# Patient Record
Sex: Male | Born: 1970 | Race: White | Hispanic: No | Marital: Married | Smoking: Never smoker
Health system: Southern US, Community
[De-identification: ages and names within clinical notes are randomized; demographics above are authoritative.]

## PROBLEM LIST (undated history)

## (undated) DIAGNOSIS — R112 Nausea with vomiting, unspecified: Secondary | ICD-10-CM

## (undated) DIAGNOSIS — R350 Frequency of micturition: Secondary | ICD-10-CM

## (undated) DIAGNOSIS — N201 Calculus of ureter: Secondary | ICD-10-CM

## (undated) DIAGNOSIS — Z9889 Other specified postprocedural states: Secondary | ICD-10-CM

## (undated) HISTORY — PX: OTHER SURGICAL HISTORY: SHX169

## (undated) HISTORY — PX: APPENDECTOMY: SHX54

---

## 1997-12-12 ENCOUNTER — Inpatient Hospital Stay (HOSPITAL_COMMUNITY): Admission: RE | Admit: 1997-12-12 | Discharge: 1997-12-16 | Payer: Self-pay | Admitting: Neurosurgery

## 1998-05-17 HISTORY — PX: OTHER SURGICAL HISTORY: SHX169

## 1998-06-19 ENCOUNTER — Ambulatory Visit (HOSPITAL_COMMUNITY): Admission: RE | Admit: 1998-06-19 | Discharge: 1998-06-19 | Payer: Self-pay | Admitting: Neurosurgery

## 1998-06-19 ENCOUNTER — Encounter: Payer: Self-pay | Admitting: Neurosurgery

## 1998-07-09 ENCOUNTER — Encounter: Payer: Self-pay | Admitting: Neurosurgery

## 1998-07-09 ENCOUNTER — Ambulatory Visit (HOSPITAL_COMMUNITY): Admission: RE | Admit: 1998-07-09 | Discharge: 1998-07-09 | Payer: Self-pay | Admitting: Neurosurgery

## 1999-05-28 ENCOUNTER — Encounter: Payer: Self-pay | Admitting: Neurosurgery

## 1999-05-28 ENCOUNTER — Ambulatory Visit (HOSPITAL_COMMUNITY): Admission: RE | Admit: 1999-05-28 | Discharge: 1999-05-28 | Payer: Self-pay | Admitting: Neurosurgery

## 2000-01-13 ENCOUNTER — Ambulatory Visit (HOSPITAL_BASED_OUTPATIENT_CLINIC_OR_DEPARTMENT_OTHER): Admission: RE | Admit: 2000-01-13 | Discharge: 2000-01-13 | Payer: Self-pay | Admitting: *Deleted

## 2000-01-13 HISTORY — PX: INGUINAL HERNIA REPAIR: SUR1180

## 2001-01-05 ENCOUNTER — Encounter: Payer: Self-pay | Admitting: Neurosurgery

## 2001-01-05 ENCOUNTER — Ambulatory Visit (HOSPITAL_COMMUNITY): Admission: RE | Admit: 2001-01-05 | Discharge: 2001-01-05 | Payer: Self-pay | Admitting: Neurosurgery

## 2001-05-17 HISTORY — PX: KNEE ARTHROSCOPY W/ MENISCECTOMY: SHX1879

## 2002-06-21 ENCOUNTER — Encounter: Payer: Self-pay | Admitting: Neurosurgery

## 2002-06-21 ENCOUNTER — Ambulatory Visit (HOSPITAL_COMMUNITY): Admission: RE | Admit: 2002-06-21 | Discharge: 2002-06-21 | Payer: Self-pay | Admitting: Neurosurgery

## 2005-04-22 ENCOUNTER — Ambulatory Visit: Payer: Self-pay | Admitting: Family Medicine

## 2005-04-29 ENCOUNTER — Ambulatory Visit: Payer: Self-pay | Admitting: Family Medicine

## 2005-09-03 ENCOUNTER — Ambulatory Visit: Payer: Self-pay | Admitting: Family Medicine

## 2006-01-24 ENCOUNTER — Ambulatory Visit: Payer: Self-pay | Admitting: Family Medicine

## 2006-02-28 ENCOUNTER — Ambulatory Visit: Payer: Self-pay | Admitting: Family Medicine

## 2006-09-21 ENCOUNTER — Encounter: Admission: RE | Admit: 2006-09-21 | Discharge: 2006-09-21 | Payer: Self-pay | Admitting: Neurosurgery

## 2010-06-07 ENCOUNTER — Encounter: Payer: Self-pay | Admitting: Neurosurgery

## 2010-11-11 ENCOUNTER — Other Ambulatory Visit: Payer: Self-pay | Admitting: Neurosurgery

## 2010-11-11 DIAGNOSIS — M479 Spondylosis, unspecified: Secondary | ICD-10-CM

## 2010-11-11 DIAGNOSIS — IMO0002 Reserved for concepts with insufficient information to code with codable children: Secondary | ICD-10-CM

## 2010-11-11 DIAGNOSIS — M792 Neuralgia and neuritis, unspecified: Secondary | ICD-10-CM

## 2010-11-17 ENCOUNTER — Other Ambulatory Visit: Payer: Self-pay

## 2010-11-19 ENCOUNTER — Other Ambulatory Visit: Payer: Self-pay

## 2010-11-19 ENCOUNTER — Ambulatory Visit
Admission: RE | Admit: 2010-11-19 | Discharge: 2010-11-19 | Disposition: A | Discharge status: Home / Self Care | Payer: Medicare Other | Source: Ambulatory Visit | Attending: Neurosurgery | Admitting: Neurosurgery

## 2010-11-19 ENCOUNTER — Ambulatory Visit
Admission: RE | Admit: 2010-11-19 | Discharge: 2010-11-19 | Disposition: A | Discharge status: Home / Self Care | Payer: Self-pay | Source: Ambulatory Visit | Attending: Neurosurgery | Admitting: Neurosurgery

## 2010-11-19 DIAGNOSIS — M792 Neuralgia and neuritis, unspecified: Secondary | ICD-10-CM

## 2010-11-19 DIAGNOSIS — M479 Spondylosis, unspecified: Secondary | ICD-10-CM

## 2010-11-19 DIAGNOSIS — IMO0002 Reserved for concepts with insufficient information to code with codable children: Secondary | ICD-10-CM

## 2010-11-19 MED ORDER — GADOBENATE DIMEGLUMINE 529 MG/ML IV SOLN
13.0000 mL | Freq: Once | INTRAVENOUS | Status: AC | PRN
  Administered 2010-11-19: 13 mL via INTRAVENOUS

## 2011-10-08 ENCOUNTER — Other Ambulatory Visit: Payer: Self-pay | Admitting: Urology

## 2011-10-12 ENCOUNTER — Encounter (HOSPITAL_COMMUNITY): Payer: Self-pay | Admitting: *Deleted

## 2011-10-12 NOTE — Progress Notes (Signed)
Bring blue folder with paper work completed nurse to review

## 2011-10-12 NOTE — Progress Notes (Signed)
No Aspirin, Ibuprofen,Toradol 72 hours prior to procedure, [employer] card, ID, eat a light dinner and take a laxative by 6 pm 10-13-11  May have clear liquids until 1130 10-14-11 then NPO for procedure may take pain med Dilaudid if needed

## 2011-10-13 NOTE — H&P (Signed)
 Problems Problems  1. Nephrolithiasis Of The Right Kidney 592.0  History of Present Illness  Andrew Patel is a 41 yo WM sent in consultation by Dr. Nyland for a 12mm right UPJ stone with obstruction.  He had the onset in 2010 of right flank pain.  He has had low back pain since that time and had an evaluation that revealed a small disc protrusion.  The pain got worse last week and he went to [facility] ER and a CT was done that showed the right sided stone.  I don't have a disc of the scan so I can't tell the density of the stone and his KUB today still has GI contrast over the right renal shadow.   He continues to have pain with some anorexia.  He has had no gross hematuria.  He has seen occasional blood in the stool.  He has a weak urinary stream with some post void dribbling.   Past Medical History Problems  1. History of  Lumbar Disc Degeneration 722.52  Surgical History Problems  1. History of  Arthroscopy Knee Left 2. History of  Arthroscopy Knee Right 3. History of  Inguinal Hernia Repair Right 4. History of  Knee Surgery Right 5. History of  Laminectomy Lumbar  Current Meds 1. Biotin 10 MG Oral Tablet; Therapy: 24May2013 to 2. HYDROmorphone HCl 2 MG Oral Tablet; Therapy: (Recorded:24May2013) to 3. Multi-Vitamin TABS; Therapy: (Recorded:24May2013) to  Allergies Medication  1. Codeine Derivatives  Family History Problems  1. Family history of  Nephrolithiasis  Social History Problems    Caffeine Use   Marital History - Currently Married   Never A Smoker   Occupation: Denied    History of  Alcohol Use  Review of Systems Genitourinary, constitutional, skin, eye, otolaryngeal, hematologic/lymphatic, cardiovascular, pulmonary, endocrine, musculoskeletal, gastrointestinal, neurological and psychiatric system(s) were reviewed and pertinent findings if present are noted.  Genitourinary: urinary frequency, feelings of urinary urgency, difficulty starting the urinary stream,  weak urinary stream and post-void dribbling.  Gastrointestinal: abdominal pain.  Constitutional: night sweats, feeling tired (fatigue) and recent weight loss.  Integumentary: skin rash/lesion and pruritus.  Eyes: blurred vision.  ENT: sinus problems.  Hematologic/Lymphatic: a tendency to easily bruise.  Musculoskeletal: back pain and joint pain.  Neurological: dizziness.  Psychiatric: anxiety.    Vitals Vital Signs [Data Includes: Last 1 Day]  24May2013 01:42PM  BMI Calculated: 22.21 BSA Calculated: 1.96 Height: 6 ft  Weight: 164 lb  Blood Pressure: 115 / 70 Temperature: 97.6 F Heart Rate: 82  Physical Exam Constitutional: Well nourished and well developed . No acute distress.  ENT:. The ears and nose are normal in appearance.  Neck: The appearance of the neck is normal and no neck mass is present.  Pulmonary: No respiratory distress and normal respiratory rhythm and effort.  Cardiovascular: Heart rate and rhythm are normal . No peripheral edema.  Abdomen: The abdomen is soft and nontender. No masses are palpated. moderate right CVA tenderness, but no left CVA tenderness. No hepatosplenomegaly noted.  Skin: Normal skin turgor, no visible rash and no visible skin lesions.  Neuro/Psych:. Mood and affect are appropriate.    Results/Data Urine [Data Includes: Last 1 Day]   24May2013  COLOR YELLOW   APPEARANCE CLEAR   SPECIFIC GRAVITY 1.025   pH 6.0   GLUCOSE NEG mg/dL  BILIRUBIN NEG   KETONE NEG mg/dL  BLOOD SMALL   PROTEIN TRACE mg/dL  UROBILINOGEN 0.2 mg/dL  NITRITE NEG   LEUKOCYTE ESTERASE NEG     SQUAMOUS EPITHELIAL/HPF NONE SEEN   WBC 0-3 WBC/hpf  RBC 7-10 RBC/hpf  BACTERIA NONE SEEN   CRYSTALS NONE SEEN   CASTS NONE SEEN   Other MUCUS    Old records or history reviewed: I have reviewed records from Dr. Nyland including the CT report.  The following images/tracing/specimen were independently visualized:  KUB today shows retained GI contrast that obscures the  right kidney. He has lumbar ray cages but no other abnormalities. I have reviewed his prior films in [facility] but he only has lumbar images that don't include the kidneys.  Will request old records/history: I have requested the CT films from [facility].    Assessment Assessed  1. Nephrolithiasis Of The Right Kidney 592.0   He has a 12mm RUPJ stone and I don't know if it is radiopaque or not.   Plan Health Maintenance (V70.0)  1. UA With REFLEX  Done: 24May2013 01:33PM Nephrolithiasis Of The Right Kidney (592.0)  2. HYDROmorphone HCl 2 MG Oral Tablet; One to two po q 4-6 hours prn pain; Last Rx:24May2013 3. Follow-up Office  Follow-up  Requested for: 28May2013 4. Follow-up Schedule Surgery Office  Follow-up  Requested for: 24May2013 5. Get Outside Records Office  Follow-up  Requested for: 24May2013 Unlinked  6. KUB  Done: 24May2013 12:00AM   I am going to refill his Dilaudid. He will do a fleets enema to try to clear the colon contrast. He will return on Tuesday for a KUB to see if the stone is visible. I will get the CT films. I discussed the options of ESWL, PCNL and ureteroscopy and believe the ESWL will be most appropriate if the stone is visible.  I reviewed the risks of bleeding, infection, renal and adjacent organ that can rarely be life threatening, failure of fragmentation, need for secondary procedures, thrombotic events and sedation risks.  

## 2011-10-14 ENCOUNTER — Encounter (HOSPITAL_COMMUNITY): Payer: Self-pay | Admitting: *Deleted

## 2011-10-14 ENCOUNTER — Ambulatory Visit (HOSPITAL_COMMUNITY): Payer: BC Managed Care – PPO

## 2011-10-14 ENCOUNTER — Encounter (HOSPITAL_COMMUNITY)
Admission: RE | Disposition: A | Discharge status: Home / Self Care | Payer: Self-pay | Source: Ambulatory Visit | Attending: Urology

## 2011-10-14 ENCOUNTER — Ambulatory Visit (HOSPITAL_COMMUNITY)
Admission: RE | Admit: 2011-10-14 | Discharge: 2011-10-14 | Disposition: A | Discharge status: Home / Self Care | Payer: BC Managed Care – PPO | Source: Ambulatory Visit | Attending: Urology | Admitting: Urology

## 2011-10-14 DIAGNOSIS — Z79899 Other long term (current) drug therapy: Secondary | ICD-10-CM | POA: Insufficient documentation

## 2011-10-14 DIAGNOSIS — N200 Calculus of kidney: Secondary | ICD-10-CM | POA: Insufficient documentation

## 2011-10-14 HISTORY — PX: EXTRACORPOREAL SHOCK WAVE LITHOTRIPSY: SHX1557

## 2011-10-14 SURGERY — LITHOTRIPSY, ESWL
Anesthesia: LOCAL | Laterality: Right

## 2011-10-14 MED ORDER — DIPHENHYDRAMINE HCL 25 MG PO CAPS
ORAL_CAPSULE | ORAL | Status: AC
  Administered 2011-10-14: 25 mg via ORAL
  Filled 2011-10-14: qty 1

## 2011-10-14 MED ORDER — CIPROFLOXACIN HCL 500 MG PO TABS
ORAL_TABLET | ORAL | Status: AC
  Administered 2011-10-14: 500 mg via ORAL
  Filled 2011-10-14: qty 1

## 2011-10-14 MED ORDER — OXYCODONE HCL 5 MG PO TABS
5.0000 mg | ORAL_TABLET | ORAL | Status: DC | PRN
  Administered 2011-10-14: 5 mg via ORAL

## 2011-10-14 MED ORDER — CIPROFLOXACIN HCL 500 MG PO TABS
500.0000 mg | ORAL_TABLET | ORAL | Status: AC
  Administered 2011-10-14: 500 mg via ORAL

## 2011-10-14 MED ORDER — HYDROMORPHONE HCL 2 MG PO TABS: 2.0000 mg | ORAL_TABLET | ORAL | Status: DC | PRN

## 2011-10-14 MED ORDER — OXYCODONE HCL 5 MG PO TABS
ORAL_TABLET | ORAL | Status: AC
  Administered 2011-10-14: 5 mg via ORAL
  Filled 2011-10-14: qty 1

## 2011-10-14 MED ORDER — DIPHENHYDRAMINE HCL 25 MG PO CAPS
25.0000 mg | ORAL_CAPSULE | ORAL | Status: AC
  Administered 2011-10-14: 25 mg via ORAL

## 2011-10-14 MED ORDER — DEXTROSE-NACL 5-0.45 % IV SOLN
INTRAVENOUS | Status: DC
  Administered 2011-10-14: 17:00:00 via INTRAVENOUS

## 2011-10-14 MED ORDER — DIAZEPAM 5 MG PO TABS
ORAL_TABLET | ORAL | Status: AC
  Administered 2011-10-14: 10 mg via ORAL
  Filled 2011-10-14: qty 2

## 2011-10-14 MED ORDER — DIAZEPAM 5 MG PO TABS
10.0000 mg | ORAL_TABLET | ORAL | Status: AC
  Administered 2011-10-14: 10 mg via ORAL

## 2011-10-14 NOTE — Discharge Instructions (Signed)
Lithotripsy Care After Refer to this sheet for the next few weeks. These discharge instructions provide you with general information on caring for yourself after you leave the hospital. Your caregiver may also give you specific instructions. Your treatment has been planned according to the most current medical practices available, but unavoidable complications sometimes occur. If you have any problems or questions after discharge, please call your caregiver. AFTER THE PROCEDURE   The recovery time will vary with the procedure done.   You will be taken to the recovery area. A nurse will watch and check your progress. Once you are awake, stable, and taking fluids well, you will be allowed to go home as long as there are no problems.   Your urine may have a red tinge for a few days after treatment. Blood loss is usually minimal.   You may have soreness in the back or flank area. This usually goes away after a few days. The procedure can cause blotches or bruises on the back where the pressure wave enters the skin. These marks usually cause only minimal discomfort and should disappear in a short time.   Stone fragments should begin to pass within 24 hours of treatment. However, a delayed passage is not unusual.   You may have pain, discomfort, and feel sick to your stomach (nauseous) when the crushed (pulverized) fragments of stone are passed down the tube from the kidney to the bladder. Stone fragments can pass soon after the procedure and may last for up to 4 to 8 weeks.   A small number of patients may have severe pain when stone fragments are not able to pass, which leads to an obstruction.   If your stone is greater than 1 inch/2.5 centimeters in diameter or if you have multiple stones that have a combined diameter greater than 1 inch/2.5 centimeters, you may require more than 1 treatment.   You must have someone drive you home.  HOME CARE INSTRUCTIONS   Rest at home until you feel your  energy improving.   Only take over-the-counter or prescription medicines for pain, discomfort, or fever as directed by your caregiver. Depending on the type of lithotripsy, you may need to take medicines (antibiotics) that kill germs and anti-inflammatory medicines for a few days.   Drink enough water and fluids to keep your urine clear or pale yellow. This helps "flush" your kidneys. It helps pass any remaining pieces of stone and prevents stones from coming back.   Most people can resume daily activities within 1 or 2 days after standard lithotripsy. It can take longer to recover from laser and percutaneous lithotripsy.   If the stones are in your urinary system, you may be asked to strain your urine at home to look for stones. Any stones that are found can be sent to a medical lab for examination.   Visit your caregiver for a follow-up appointment in a few weeks. Your doctor may remove your stent if you have one. Your caregiver will also check to see whether stone particles still remain.  SEEK MEDICAL CARE IF:   You have an oral temperature above 102 F (38.9 C).   Your pain is not relieved by medicine.   You have a lasting nauseous feeling.   You feel there is too much blood in the urine.   You develop persistent problems with frequent and/or painful urination that does not at least partially improve after 2 days following the procedure.   You have a congested   cough.   You feel lightheaded.   You develop a rash or any other signs that might suggest an allergic problem.   You develop any reaction or side effects to your medicine(s).  SEEK IMMEDIATE MEDICAL CARE IF:   You experience severe back and/or flank pain.   You see nothing but blood when you urinate.   You cannot pass any urine at all.   You have an oral temperature above 102 F (38.9 C), not controlled by medicine.   You develop shortness of breath, difficulty breathing, or chest pain.   You develop vomiting  that will not stop after 6 to 8 hours.   You have a fainting episode.  MAKE SURE YOU:   Understand these instructions.   Will watch your condition.   Will get help right away if you are not doing well or get worse.  Document Released: 05/23/2007 Document Revised: 04/22/2011 Document Reviewed: 05/23/2007 Exit[employer] Patient Information 2012 [employer]. 

## 2011-10-14 NOTE — Interval H&P Note (Signed)
History and Physical Interval Note:  10/14/2011 5:29 PM  Andrew Patel  has presented today for surgery, with the diagnosis of Right Ureteral Pelvic Junction Stone  The various methods of treatment have been discussed with the patient and family. After consideration of risks, benefits and other options for treatment, the patient has consented to  Procedure(s) (LRB): EXTRACORPOREAL SHOCK WAVE LITHOTRIPSY (ESWL) (Right) as a surgical intervention .  The patients' history has been reviewed, patient examined, no change in status, stable for surgery.  I have reviewed the patients' chart and labs.  Questions were answered to the patient's satisfaction.     Josephine Wooldridge J

## 2011-10-15 ENCOUNTER — Encounter (HOSPITAL_COMMUNITY): Payer: Self-pay

## 2011-10-19 ENCOUNTER — Encounter (HOSPITAL_BASED_OUTPATIENT_CLINIC_OR_DEPARTMENT_OTHER)
Admission: RE | Disposition: A | Discharge status: Home / Self Care | Payer: Self-pay | Source: Ambulatory Visit | Attending: Urology

## 2011-10-19 ENCOUNTER — Ambulatory Visit (HOSPITAL_BASED_OUTPATIENT_CLINIC_OR_DEPARTMENT_OTHER)
Admission: RE | Admit: 2011-10-19 | Discharge: 2011-10-19 | Disposition: A | Discharge status: Home / Self Care | Payer: BC Managed Care – PPO | Source: Ambulatory Visit | Attending: Urology | Admitting: Urology

## 2011-10-19 ENCOUNTER — Ambulatory Visit (HOSPITAL_BASED_OUTPATIENT_CLINIC_OR_DEPARTMENT_OTHER): Payer: BC Managed Care – PPO | Admitting: Anesthesiology

## 2011-10-19 ENCOUNTER — Encounter (HOSPITAL_BASED_OUTPATIENT_CLINIC_OR_DEPARTMENT_OTHER): Payer: Self-pay | Admitting: *Deleted

## 2011-10-19 ENCOUNTER — Other Ambulatory Visit: Payer: Self-pay | Admitting: Urology

## 2011-10-19 ENCOUNTER — Encounter (HOSPITAL_BASED_OUTPATIENT_CLINIC_OR_DEPARTMENT_OTHER): Payer: Self-pay | Admitting: Anesthesiology

## 2011-10-19 DIAGNOSIS — Z79899 Other long term (current) drug therapy: Secondary | ICD-10-CM | POA: Insufficient documentation

## 2011-10-19 DIAGNOSIS — N1330 Unspecified hydronephrosis: Secondary | ICD-10-CM | POA: Insufficient documentation

## 2011-10-19 DIAGNOSIS — N200 Calculus of kidney: Secondary | ICD-10-CM | POA: Insufficient documentation

## 2011-10-19 DIAGNOSIS — M51379 Other intervertebral disc degeneration, lumbosacral region without mention of lumbar back pain or lower extremity pain: Secondary | ICD-10-CM | POA: Insufficient documentation

## 2011-10-19 DIAGNOSIS — N201 Calculus of ureter: Secondary | ICD-10-CM | POA: Insufficient documentation

## 2011-10-19 DIAGNOSIS — M5137 Other intervertebral disc degeneration, lumbosacral region: Secondary | ICD-10-CM | POA: Insufficient documentation

## 2011-10-19 HISTORY — PX: OTHER SURGICAL HISTORY: SHX169

## 2011-10-19 LAB — POCT I-STAT 4, (NA,K, GLUC, HGB,HCT): Sodium: 136 mEq/L (ref 135–145)

## 2011-10-19 SURGERY — CYSTOURETEROSCOPY, WITH RETROGRADE PYELOGRAM AND STENT INSERTION
Anesthesia: General | Site: Ureter | Laterality: Right | Wound class: Clean Contaminated

## 2011-10-19 MED ORDER — BELLADONNA ALKALOIDS-OPIUM 16.2-60 MG RE SUPP
RECTAL | Status: DC | PRN
  Administered 2011-10-19: 1 via RECTAL

## 2011-10-19 MED ORDER — FENTANYL CITRATE 0.05 MG/ML IJ SOLN
INTRAMUSCULAR | Status: DC | PRN
  Administered 2011-10-19: 100 ug via INTRAVENOUS

## 2011-10-19 MED ORDER — IOHEXOL 350 MG/ML SOLN
INTRAVENOUS | Status: DC | PRN
  Administered 2011-10-19: 50 mL

## 2011-10-19 MED ORDER — LACTATED RINGERS IV SOLN
INTRAVENOUS | Status: DC
  Administered 2011-10-19 (×2): via INTRAVENOUS

## 2011-10-19 MED ORDER — LIDOCAINE HCL 1 % IJ SOLN
INTRAMUSCULAR | Status: DC | PRN
  Administered 2011-10-19: 80 mg via INTRADERMAL

## 2011-10-19 MED ORDER — KETOROLAC TROMETHAMINE 30 MG/ML IJ SOLN
INTRAMUSCULAR | Status: DC | PRN
  Administered 2011-10-19: 30 mg via INTRAVENOUS

## 2011-10-19 MED ORDER — FENTANYL CITRATE 0.05 MG/ML IJ SOLN: 25.0000 ug | INTRAMUSCULAR | Status: DC | PRN

## 2011-10-19 MED ORDER — PROPOFOL 10 MG/ML IV BOLUS
INTRAVENOUS | Status: DC | PRN
  Administered 2011-10-19: 200 mg via INTRAVENOUS

## 2011-10-19 MED ORDER — MIDAZOLAM HCL 5 MG/5ML IJ SOLN
INTRAMUSCULAR | Status: DC | PRN
  Administered 2011-10-19: 2 mg via INTRAVENOUS

## 2011-10-19 MED ORDER — URIBEL 118 MG PO CAPS: 1.0000 | ORAL_CAPSULE | Freq: Three times a day (TID) | ORAL | Status: AC | PRN

## 2011-10-19 MED ORDER — LIDOCAINE HCL 2 % EX GEL
CUTANEOUS | Status: DC | PRN
  Administered 2011-10-19: 1 via URETHRAL

## 2011-10-19 MED ORDER — SODIUM CHLORIDE 0.9 % IR SOLN
Status: DC | PRN
  Administered 2011-10-19: 6000 mL

## 2011-10-19 MED ORDER — SUCCINYLCHOLINE CHLORIDE 20 MG/ML IJ SOLN
INTRAMUSCULAR | Status: DC | PRN
  Administered 2011-10-19: 100 mg via INTRAVENOUS

## 2011-10-19 MED ORDER — CIPROFLOXACIN IN D5W 400 MG/200ML IV SOLN
400.0000 mg | INTRAVENOUS | Status: AC
  Administered 2011-10-19: 400 mg via INTRAVENOUS

## 2011-10-19 MED ORDER — DEXAMETHASONE SODIUM PHOSPHATE 4 MG/ML IJ SOLN
INTRAMUSCULAR | Status: DC | PRN
  Administered 2011-10-19: 10 mg via INTRAVENOUS

## 2011-10-19 MED ORDER — ONDANSETRON HCL 4 MG/2ML IJ SOLN
INTRAMUSCULAR | Status: DC | PRN
  Administered 2011-10-19: 4 mg via INTRAVENOUS

## 2011-10-19 SURGICAL SUPPLY — 39 items
ADAPTER CATH URET PLST 4-6FR (CATHETERS) IMPLANT
ADPR CATH URET STRL DISP 4-6FR (CATHETERS)
APL SKNCLS STERI-STRIP NONHPOA (GAUZE/BANDAGES/DRESSINGS)
BAG DRAIN URO-CYSTO SKYTR STRL (DRAIN) ×2 IMPLANT
BAG DRN UROCATH (DRAIN) ×1
BASKET LASER NITINOL 1.9FR (BASKET) IMPLANT
BASKET STNLS GEMINI 4WIRE 3FR (BASKET) IMPLANT
BASKET ZERO TIP NITINOL 2.4FR (BASKET) ×2 IMPLANT
BENZOIN TINCTURE PRP APPL 2/3 (GAUZE/BANDAGES/DRESSINGS) IMPLANT
BSKT STON RTRVL 120 1.9FR (BASKET)
BSKT STON RTRVL GEM 120X11 3FR (BASKET)
BSKT STON RTRVL ZERO TP 2.4FR (BASKET) ×1
CANISTER SUCT LVC 12 LTR MEDI- (MISCELLANEOUS) ×1 IMPLANT
CATH INTERMIT  6FR 70CM (CATHETERS) ×1 IMPLANT
CATH URET 5FR 28IN CONE TIP (BALLOONS)
CATH URET 5FR 28IN OPEN ENDED (CATHETERS) IMPLANT
CATH URET 5FR 70CM CONE TIP (BALLOONS) IMPLANT
CLOTH BEACON ORANGE TIMEOUT ST (SAFETY) ×2 IMPLANT
DRAPE CAMERA CLOSED 9X96 (DRAPES) ×2 IMPLANT
GLOVE BIO SURGEON STRL SZ7.5 (GLOVE) ×2 IMPLANT
GLOVE ECLIPSE 6.5 STRL STRAW (GLOVE) ×1 IMPLANT
GLOVE INDICATOR 6.5 STRL GRN (GLOVE) ×1 IMPLANT
GOWN STRL REIN XL XLG (GOWN DISPOSABLE) ×2 IMPLANT
GUIDEWIRE 0.038 PTFE COATED (WIRE) IMPLANT
GUIDEWIRE ANG ZIPWIRE 038X150 (WIRE) IMPLANT
GUIDEWIRE STR DUAL SENSOR (WIRE) IMPLANT
IV NS IRRIG 3000ML ARTHROMATIC (IV SOLUTION) ×4 IMPLANT
KIT BALLIN UROMAX 15FX10 (LABEL) IMPLANT
KIT BALLN UROMAX 15FX4 (MISCELLANEOUS) IMPLANT
KIT BALLN UROMAX 26 75X4 (MISCELLANEOUS)
LASER FIBER DISP (UROLOGICAL SUPPLIES) ×1 IMPLANT
LASER FIBER DISP 1000U (UROLOGICAL SUPPLIES) IMPLANT
NS IRRIG 500ML POUR BTL (IV SOLUTION) IMPLANT
PACK CYSTOSCOPY (CUSTOM PROCEDURE TRAY) ×2 IMPLANT
SET HIGH PRES BAL DIL (LABEL)
SHEATH URET ACCESS 12FR/35CM (UROLOGICAL SUPPLIES) ×1 IMPLANT
SHEATH URET ACCESS 12FR/55CM (UROLOGICAL SUPPLIES) IMPLANT
STENT CONTOUR 7FRX26 (STENTS) ×1 IMPLANT
SYRINGE IRR TOOMEY STRL 70CC (SYRINGE) ×1 IMPLANT

## 2011-10-19 NOTE — Anesthesia Procedure Notes (Signed)
Procedure Name: Intubation Date/Time: 10/19/2011 12:18 PM Performed by: Maris Berger T Pre-anesthesia Checklist: Patient identified, Emergency Drugs available, Suction available and Patient being monitored Patient Re-evaluated:Patient Re-evaluated prior to inductionOxygen Delivery Method: Circle System Utilized Preoxygenation: Pre-oxygenation with 100% oxygen Intubation Type: IV induction Ventilation: Mask ventilation without difficulty Laryngoscope Size: Mac and 4 Grade View: Grade II Tube type: Oral Tube size: 8.0 mm Number of attempts: 1 Airway Equipment and Method: stylet Placement Confirmation: ETT inserted through vocal cords under direct vision,  positive ETCO2 and breath sounds checked- equal and bilateral Secured at: 23 cm Tube secured with: Tape Dental Injury: Teeth and Oropharynx as per pre-operative assessment

## 2011-10-19 NOTE — Op Note (Signed)
Preoperative diagnosis: Right distal ureteral stone fragments with right sided hydronephrosis status post ESWL Postoperative diagnosis: Same  Procedure: Cystoscopy, right retrograde pyelogram, ureteroscopy, holmium laser lithotripsy, basket of stone fragments, right double-J stent placement 7 French x26 cm.   Surgeon: Valetta Fuller M.D.  Anesthesia: Gen.  Indications: Patient underwent recent ESWL for a large right renal calculus by Dr. Bjorn Pippin. Patient presented today with severe right-sided flank pain and nausea. KUB demonstrated what appeared to be a Steinstrasse in the right distal ureter along with 2 larger fragments in the right proximal ureter. Dr. Annabell Howells discussed treatment options with the patient but was unable to provide surgical services today. He asked me if I would be able to assist in taking care of Mr. made in the acute setting. We discussed different options. Our primary objective is to unobstruct the right kidney and will try to definitively manage stones if possible in a safe manner.     Technique and findings: Patient was brought the operating room where he had successful induction general anesthesia. He was prepped and draped in usual manner. He received perioperative antibiotics and PAS compression boots were utilized. Appropriate surgical timeout was performed. Cystoscopy was unremarkable. There did appear to be inflammation and edema around the right ureteral orifice/right hemitrigone. Right retrograde pyelogram showed what appeared to be high-grade obstruction with multiple fragments I in the distal right ureter extending up for several centimeters. There did appear to be some medial deviation of the ureter at the mid ureteral level and some narrowing over the vessels. A guidewire was manipulated through this area and up to the renal pelvis. The right ureteral orifice was quite tight as was the intermural ureter. The ureter was engaged with a 6 French ureteroscope. A classic  Steinstrasse was encountered. Holmium laser lithotripter fiber was used to fracture the stones into breakup the long jammed. At least 10-12 stone fragments were basket extracted. There was quite a bit of inflammation and edema in the distal ureter we made an attempt to try to perform a more proximal ureteroscopy. There was substantial narrowing at the pelvic gram and therefore I felt it prudent to place a double-J stent having unobstructed the distal ureter. The more proximal fragments may require a repeat lithotripsy or potentially ureteroscopy at a later date after there is increased ureteral dilation from the stent. The patient was brought to recovery room in stable condition.

## 2011-10-19 NOTE — H&P (View-Only) (Signed)
 Problems Problems  1. Nephrolithiasis Of The Right Kidney 592.0  History of Present Illness  Andrew Patel is a 41 yo WM sent in consultation by Dr. Nyland for a 12mm right UPJ stone with obstruction.  He had the onset in 2010 of right flank pain.  He has had low back pain since that time and had an evaluation that revealed a small disc protrusion.  The pain got worse last week and he went to [facility] ER and a CT was done that showed the right sided stone.  I don't have a disc of the scan so I can't tell the density of the stone and his KUB today still has GI contrast over the right renal shadow.   He continues to have pain with some anorexia.  He has had no gross hematuria.  He has seen occasional blood in the stool.  He has a weak urinary stream with some post void dribbling.   Past Medical History Problems  1. History of  Lumbar Disc Degeneration 722.52  Surgical History Problems  1. History of  Arthroscopy Knee Left 2. History of  Arthroscopy Knee Right 3. History of  Inguinal Hernia Repair Right 4. History of  Knee Surgery Right 5. History of  Laminectomy Lumbar  Current Meds 1. Biotin 10 MG Oral Tablet; Therapy: 24May2013 to 2. HYDROmorphone HCl 2 MG Oral Tablet; Therapy: (Recorded:24May2013) to 3. Multi-Vitamin TABS; Therapy: (Recorded:24May2013) to  Allergies Medication  1. Codeine Derivatives  Family History Problems  1. Family history of  Nephrolithiasis  Social History Problems    Caffeine Use   Marital History - Currently Married   Never A Smoker   Occupation: Denied    History of  Alcohol Use  Review of Systems Genitourinary, constitutional, skin, eye, otolaryngeal, hematologic/lymphatic, cardiovascular, pulmonary, endocrine, musculoskeletal, gastrointestinal, neurological and psychiatric system(s) were reviewed and pertinent findings if present are noted.  Genitourinary: urinary frequency, feelings of urinary urgency, difficulty starting the urinary stream,  weak urinary stream and post-void dribbling.  Gastrointestinal: abdominal pain.  Constitutional: night sweats, feeling tired (fatigue) and recent weight loss.  Integumentary: skin rash/lesion and pruritus.  Eyes: blurred vision.  ENT: sinus problems.  Hematologic/Lymphatic: a tendency to easily bruise.  Musculoskeletal: back pain and joint pain.  Neurological: dizziness.  Psychiatric: anxiety.    Vitals Vital Signs [Data Includes: Last 1 Day]  24May2013 01:42PM  BMI Calculated: 22.21 BSA Calculated: 1.96 Height: 6 ft  Weight: 164 lb  Blood Pressure: 115 / 70 Temperature: 97.6 F Heart Rate: 82  Physical Exam Constitutional: Well nourished and well developed . No acute distress.  ENT:. The ears and nose are normal in appearance.  Neck: The appearance of the neck is normal and no neck mass is present.  Pulmonary: No respiratory distress and normal respiratory rhythm and effort.  Cardiovascular: Heart rate and rhythm are normal . No peripheral edema.  Abdomen: The abdomen is soft and nontender. No masses are palpated. moderate right CVA tenderness, but no left CVA tenderness. No hepatosplenomegaly noted.  Skin: Normal skin turgor, no visible rash and no visible skin lesions.  Neuro/Psych:. Mood and affect are appropriate.    Results/Data Urine [Data Includes: Last 1 Day]   24May2013  COLOR YELLOW   APPEARANCE CLEAR   SPECIFIC GRAVITY 1.025   pH 6.0   GLUCOSE NEG mg/dL  BILIRUBIN NEG   KETONE NEG mg/dL  BLOOD SMALL   PROTEIN TRACE mg/dL  UROBILINOGEN 0.2 mg/dL  NITRITE NEG   LEUKOCYTE ESTERASE NEG     SQUAMOUS EPITHELIAL/HPF NONE SEEN   WBC 0-3 WBC/hpf  RBC 7-10 RBC/hpf  BACTERIA NONE SEEN   CRYSTALS NONE SEEN   CASTS NONE SEEN   Other MUCUS    Old records or history reviewed: I have reviewed records from Dr. Nyland including the CT report.  The following images/tracing/specimen were independently visualized:  KUB today shows retained GI contrast that obscures the  right kidney. He has lumbar ray cages but no other abnormalities. I have reviewed his prior films in [facility] but he only has lumbar images that don't include the kidneys.  Will request old records/history: I have requested the CT films from [facility].    Assessment Assessed  1. Nephrolithiasis Of The Right Kidney 592.0   He has a 12mm RUPJ stone and I don't know if it is radiopaque or not.   Plan Health Maintenance (V70.0)  1. UA With REFLEX  Done: 24May2013 01:33PM Nephrolithiasis Of The Right Kidney (592.0)  2. HYDROmorphone HCl 2 MG Oral Tablet; One to two po q 4-6 hours prn pain; Last Rx:24May2013 3. Follow-up Office  Follow-up  Requested for: 28May2013 4. Follow-up Schedule Surgery Office  Follow-up  Requested for: 24May2013 5. Get Outside Records Office  Follow-up  Requested for: 24May2013 Unlinked  6. KUB  Done: 24May2013 12:00AM   I am going to refill his Dilaudid. He will do a fleets enema to try to clear the colon contrast. He will return on Tuesday for a KUB to see if the stone is visible. I will get the CT films. I discussed the options of ESWL, PCNL and ureteroscopy and believe the ESWL will be most appropriate if the stone is visible.  I reviewed the risks of bleeding, infection, renal and adjacent organ that can rarely be life threatening, failure of fragmentation, need for secondary procedures, thrombotic events and sedation risks.  

## 2011-10-19 NOTE — Anesthesia Postprocedure Evaluation (Signed)
  Anesthesia Post-op Note  Patient: Andrew L Vanderlinde  Procedure(s) Performed: Procedure(s) (LRB): CYSTOSCOPY WITH RETROGRADE PYELOGRAM, URETEROSCOPY AND STENT PLACEMENT (Right) HOLMIUM LASER APPLICATION (Right)  Patient Location: PACU  Anesthesia Type: General  Level of Consciousness: awake and alert   Airway and Oxygen Therapy: Patient Spontanous Breathing  Post-op Pain: mild  Post-op Assessment: Post-op Vital signs reviewed, Patient's Cardiovascular Status Stable, Respiratory Function Stable, Patent Airway and No signs of Nausea or vomiting  Post-op Vital Signs: stable  Complications: No apparent anesthesia complications

## 2011-10-19 NOTE — Interval H&P Note (Signed)
History and Physical Interval Note:  10/19/2011 1:03 PM  Andrew Patel  has presented today for surgery, with the diagnosis of RIGHT URETERAL STONE  The various methods of treatment have been discussed with the patient and family. After consideration of risks, benefits and other options for treatment, the patient has consented to  Procedure(s) (LRB): CYSTOSCOPY WITH RETROGRADE PYELOGRAM, URETEROSCOPY AND STENT PLACEMENT (Right) HOLMIUM LASER APPLICATION (Right) as a surgical intervention .  The patients' history has been reviewed, patient examined, no change in status, stable for surgery.  I have reviewed the patients' chart and labs.  Questions were answered to the patient's satisfaction.     Andrew Patel S  Andrew Patel was seen by Dr. Annabell Howells our office today. On KUB imaging he had what appeared to be a classic distal Andrew Patel as well some proximal ureteral calculi status post his ESWL. Dr. Annabell Howells felt that the patient needed urgent intervention and he was unable to provide surgical services to the patient today. He asked me to be involved in his care. Dr. Annabell Howells discussed the situation with Andrew Patel. I also discuss things with the patient in the holding area. We recommended an attempt at ureteroscopic removal of the Andrew Patel and if feasible potentially removal and/or treatment of the more proximal fragments I. if he could be done in a safe manner. Otherwise double-J stent placement to assure drainage of the kidney. Rationale was discussed with the patient and full informed consent obtained.

## 2011-10-19 NOTE — Discharge Instructions (Addendum)

## 2011-10-19 NOTE — Anesthesia Preprocedure Evaluation (Signed)
Anesthesia Evaluation  Patient identified by MRN, date of birth, ID band Patient awake    Reviewed: Allergy & Precautions, H&P , NPO status , Patient's Chart, lab work & pertinent test results  Airway Mallampati: II TM Distance: >3 FB Neck ROM: full    Dental No notable dental hx. (+) Caps, Teeth Intact and Dental Advisory Given,    Pulmonary neg pulmonary ROS,  breath sounds clear to auscultation  Pulmonary exam normal       Cardiovascular Exercise Tolerance: Good negative cardio ROS  Rhythm:regular Rate:Normal     Neuro/Psych negative neurological ROS  negative psych ROS   GI/Hepatic negative GI ROS, Neg liver ROS,   Endo/Other  negative endocrine ROS  Renal/GU negative Renal ROS  negative genitourinary   Musculoskeletal   Abdominal   Peds  Hematology negative hematology ROS (+)   Anesthesia Other Findings   Reproductive/Obstetrics negative OB ROS                           Anesthesia Physical Anesthesia Plan  ASA: I  Anesthesia Plan: General   Post-op Pain Management:    Induction: Intravenous  Airway Management Planned: LMA  Additional Equipment:   Intra-op Plan:   Post-operative Plan:   Informed Consent: I have reviewed the patients History and Physical, chart, labs and discussed the procedure including the risks, benefits and alternatives for the proposed anesthesia with the patient or authorized representative who has indicated his/her understanding and acceptance.   Dental Advisory Given  Plan Discussed with: CRNA and Surgeon  Anesthesia Plan Comments:         Anesthesia Quick Evaluation

## 2011-10-19 NOTE — Transfer of Care (Signed)
Immediate Anesthesia Transfer of Care Note  Patient: Andrew Patel  Procedure(s) Performed: Procedure(s) (LRB): CYSTOSCOPY WITH RETROGRADE PYELOGRAM, URETEROSCOPY AND STENT PLACEMENT (Right) HOLMIUM LASER APPLICATION (Right)  Patient Location: PACU  Anesthesia Type: General  Level of Consciousness: awake and oriented  Airway & Oxygen Therapy: Patient Spontanous Breathing and Patient connected to nasal cannula oxygen  Post-op Assessment: Report given to PACU RN  Post vital signs: Reviewed and stable  Complications: No apparent anesthesia complications

## 2011-10-20 NOTE — Progress Notes (Signed)
Message left ok per patient 

## 2011-10-25 ENCOUNTER — Encounter (HOSPITAL_BASED_OUTPATIENT_CLINIC_OR_DEPARTMENT_OTHER): Payer: Self-pay

## 2011-10-26 ENCOUNTER — Other Ambulatory Visit: Payer: Self-pay | Admitting: Urology

## 2011-10-29 ENCOUNTER — Encounter (HOSPITAL_BASED_OUTPATIENT_CLINIC_OR_DEPARTMENT_OTHER): Payer: Self-pay | Admitting: *Deleted

## 2011-10-29 NOTE — Progress Notes (Signed)
NPO AFTER MN. ARRIVES AT 0900. NEEDS HG . MAY TAKE DILAUDID IF NEEDED W/ SIP OF WATER.

## 2011-11-04 ENCOUNTER — Ambulatory Visit (HOSPITAL_BASED_OUTPATIENT_CLINIC_OR_DEPARTMENT_OTHER)
Admission: RE | Admit: 2011-11-04 | Discharge: 2011-11-04 | Disposition: A | Discharge status: Home / Self Care | Payer: BC Managed Care – PPO | Source: Ambulatory Visit | Attending: Urology | Admitting: Urology

## 2011-11-04 ENCOUNTER — Encounter (HOSPITAL_BASED_OUTPATIENT_CLINIC_OR_DEPARTMENT_OTHER): Payer: Self-pay | Admitting: Anesthesiology

## 2011-11-04 ENCOUNTER — Encounter (HOSPITAL_BASED_OUTPATIENT_CLINIC_OR_DEPARTMENT_OTHER)
Admission: RE | Disposition: A | Discharge status: Home / Self Care | Payer: Self-pay | Source: Ambulatory Visit | Attending: Urology

## 2011-11-04 ENCOUNTER — Ambulatory Visit (HOSPITAL_BASED_OUTPATIENT_CLINIC_OR_DEPARTMENT_OTHER): Payer: BC Managed Care – PPO | Admitting: Anesthesiology

## 2011-11-04 ENCOUNTER — Encounter (HOSPITAL_BASED_OUTPATIENT_CLINIC_OR_DEPARTMENT_OTHER): Payer: Self-pay | Admitting: *Deleted

## 2011-11-04 DIAGNOSIS — Z79899 Other long term (current) drug therapy: Secondary | ICD-10-CM | POA: Insufficient documentation

## 2011-11-04 DIAGNOSIS — N201 Calculus of ureter: Secondary | ICD-10-CM | POA: Insufficient documentation

## 2011-11-04 HISTORY — DX: Frequency of micturition: R35.0

## 2011-11-04 HISTORY — DX: Calculus of ureter: N20.1

## 2011-11-04 HISTORY — DX: Nausea with vomiting, unspecified: R11.2

## 2011-11-04 HISTORY — PX: CYSTOSCOPY W/ URETERAL STENT PLACEMENT: SHX1429

## 2011-11-04 HISTORY — DX: Nausea with vomiting, unspecified: Z98.890

## 2011-11-04 HISTORY — PX: URETEROSCOPY: SHX842

## 2011-11-04 SURGERY — URETEROSCOPY
Anesthesia: General | Site: Ureter | Laterality: Right

## 2011-11-04 MED ORDER — KETOROLAC TROMETHAMINE 30 MG/ML IJ SOLN
INTRAMUSCULAR | Status: DC | PRN
  Administered 2011-11-04: 30 mg via INTRAVENOUS

## 2011-11-04 MED ORDER — METOCLOPRAMIDE HCL 5 MG/ML IJ SOLN
INTRAMUSCULAR | Status: DC | PRN
  Administered 2011-11-04: 10 mg via INTRAVENOUS

## 2011-11-04 MED ORDER — LIDOCAINE HCL (CARDIAC) 20 MG/ML IV SOLN
INTRAVENOUS | Status: DC | PRN
  Administered 2011-11-04: 100 mg via INTRAVENOUS

## 2011-11-04 MED ORDER — ONDANSETRON HCL 4 MG/2ML IJ SOLN: INTRAMUSCULAR | Status: DC | PRN

## 2011-11-04 MED ORDER — SODIUM CHLORIDE 0.9 % IJ SOLN: 3.0000 mL | INTRAMUSCULAR | Status: DC | PRN

## 2011-11-04 MED ORDER — BELLADONNA ALKALOIDS-OPIUM 16.2-60 MG RE SUPP
RECTAL | Status: DC | PRN
  Administered 2011-11-04: 1 via RECTAL

## 2011-11-04 MED ORDER — MIDAZOLAM HCL 5 MG/5ML IJ SOLN
INTRAMUSCULAR | Status: DC | PRN
  Administered 2011-11-04: 2 mg via INTRAVENOUS

## 2011-11-04 MED ORDER — CIPROFLOXACIN IN D5W 400 MG/200ML IV SOLN
400.0000 mg | INTRAVENOUS | Status: AC
  Administered 2011-11-04: 400 mg via INTRAVENOUS

## 2011-11-04 MED ORDER — ONDANSETRON HCL 4 MG/2ML IJ SOLN
INTRAMUSCULAR | Status: DC | PRN
  Administered 2011-11-04: 4 mg via INTRAVENOUS

## 2011-11-04 MED ORDER — ONDANSETRON HCL 4 MG/2ML IJ SOLN: 4.0000 mg | Freq: Four times a day (QID) | INTRAMUSCULAR | Status: DC | PRN

## 2011-11-04 MED ORDER — OXYCODONE HCL 5 MG PO TABS: 5.0000 mg | ORAL_TABLET | ORAL | Status: DC | PRN

## 2011-11-04 MED ORDER — SODIUM CHLORIDE 0.9 % IV SOLN: 250.0000 mL | INTRAVENOUS | Status: DC | PRN

## 2011-11-04 MED ORDER — PHENAZOPYRIDINE HCL 200 MG PO TABS: 200.0000 mg | ORAL_TABLET | Freq: Three times a day (TID) | ORAL | Status: AC | PRN

## 2011-11-04 MED ORDER — ACETAMINOPHEN 325 MG PO TABS: 650.0000 mg | ORAL_TABLET | ORAL | Status: DC | PRN

## 2011-11-04 MED ORDER — DEXAMETHASONE SODIUM PHOSPHATE 4 MG/ML IJ SOLN
INTRAMUSCULAR | Status: DC | PRN
  Administered 2011-11-04: 4 mg via INTRAVENOUS

## 2011-11-04 MED ORDER — SCOPOLAMINE 1 MG/3DAYS TD PT72
1.0000 | MEDICATED_PATCH | TRANSDERMAL | Status: DC
  Administered 2011-11-04: 1.5 mg via TRANSDERMAL

## 2011-11-04 MED ORDER — KETOROLAC TROMETHAMINE 30 MG/ML IJ SOLN: INTRAMUSCULAR | Status: DC | PRN

## 2011-11-04 MED ORDER — LACTATED RINGERS IV SOLN
INTRAVENOUS | Status: DC | PRN
  Administered 2011-11-04 (×3): via INTRAVENOUS

## 2011-11-04 MED ORDER — LIDOCAINE HCL 2 % EX GEL
CUTANEOUS | Status: DC | PRN
  Administered 2011-11-04: 1 via URETHRAL

## 2011-11-04 MED ORDER — FENTANYL CITRATE 0.05 MG/ML IJ SOLN: 25.0000 ug | INTRAMUSCULAR | Status: DC | PRN

## 2011-11-04 MED ORDER — SODIUM CHLORIDE 0.9 % IJ SOLN: 3.0000 mL | Freq: Two times a day (BID) | INTRAMUSCULAR | Status: DC

## 2011-11-04 MED ORDER — PROPOFOL 10 MG/ML IV EMUL
INTRAVENOUS | Status: DC | PRN
  Administered 2011-11-04: 100 mg via INTRAVENOUS
  Administered 2011-11-04: 300 mg via INTRAVENOUS

## 2011-11-04 MED ORDER — OXYCODONE-ACETAMINOPHEN 5-325 MG PO TABS: 1.0000 | ORAL_TABLET | ORAL | Status: AC | PRN

## 2011-11-04 MED ORDER — ACETAMINOPHEN 650 MG RE SUPP: 650.0000 mg | RECTAL | Status: DC | PRN

## 2011-11-04 MED ORDER — FENTANYL CITRATE 0.05 MG/ML IJ SOLN
25.0000 ug | INTRAMUSCULAR | Status: DC | PRN
Start: 2011-11-04 — End: 2011-11-04

## 2011-11-04 MED ORDER — FENTANYL CITRATE 0.05 MG/ML IJ SOLN
INTRAMUSCULAR | Status: DC | PRN
  Administered 2011-11-04: 50 ug via INTRAVENOUS
  Administered 2011-11-04: 25 ug via INTRAVENOUS
  Administered 2011-11-04 (×2): 50 ug via INTRAVENOUS
  Administered 2011-11-04: 25 ug via INTRAVENOUS
  Administered 2011-11-04: 50 ug via INTRAVENOUS
  Administered 2011-11-04 (×2): 25 ug via INTRAVENOUS

## 2011-11-04 MED ORDER — SODIUM CHLORIDE 0.9 % IR SOLN
Status: DC | PRN
  Administered 2011-11-04: 3000 mL via INTRAVESICAL

## 2011-11-04 MED ORDER — LACTATED RINGERS IV SOLN
INTRAVENOUS | Status: DC
  Administered 2011-11-04: 10:00:00 via INTRAVENOUS

## 2011-11-04 SURGICAL SUPPLY — 39 items
BAG DRAIN URO-CYSTO SKYTR STRL (DRAIN) ×2 IMPLANT
BAG DRN UROCATH (DRAIN) ×1
BASKET LASER NITINOL 1.9FR (BASKET) IMPLANT
BASKET SEGURA 3FR (UROLOGICAL SUPPLIES) IMPLANT
BASKET STNLS GEMINI 4WIRE 3FR (BASKET) IMPLANT
BASKET ZERO TIP NITINOL 2.4FR (BASKET) ×1 IMPLANT
BRUSH URET BIOPSY 3F (UROLOGICAL SUPPLIES) IMPLANT
BSKT STON RTRVL 120 1.9FR (BASKET)
BSKT STON RTRVL GEM 120X11 3FR (BASKET)
BSKT STON RTRVL ZERO TP 2.4FR (BASKET) ×1
CANISTER SUCT LVC 12 LTR MEDI- (MISCELLANEOUS) IMPLANT
CATH URET 5FR 28IN CONE TIP (BALLOONS)
CATH URET 5FR 28IN OPEN ENDED (CATHETERS) IMPLANT
CATH URET 5FR 70CM CONE TIP (BALLOONS) IMPLANT
CLOTH BEACON ORANGE TIMEOUT ST (SAFETY) ×2 IMPLANT
DRAPE CAMERA CLOSED 9X96 (DRAPES) ×2 IMPLANT
ELECT REM PT RETURN 9FT ADLT (ELECTROSURGICAL)
ELECTRODE REM PT RTRN 9FT ADLT (ELECTROSURGICAL) IMPLANT
GLOVE BIOGEL M 6.5 STRL (GLOVE) ×1 IMPLANT
GLOVE INDICATOR 6.5 STRL GRN (GLOVE) ×1 IMPLANT
GLOVE SURG SS PI 8.0 STRL IVOR (GLOVE) ×2 IMPLANT
GOWN STRL NON-REIN LRG LVL3 (GOWN DISPOSABLE) ×3 IMPLANT
GOWN STRL REIN XL XLG (GOWN DISPOSABLE) ×2 IMPLANT
GUIDEWIRE 0.038 PTFE COATED (WIRE) IMPLANT
GUIDEWIRE ANG ZIPWIRE 038X150 (WIRE) IMPLANT
GUIDEWIRE STR DUAL SENSOR (WIRE) ×3 IMPLANT
IV NS IRRIG 3000ML ARTHROMATIC (IV SOLUTION) ×4 IMPLANT
KIT BALLIN UROMAX 15FX10 (LABEL) IMPLANT
KIT BALLN UROMAX 15FX4 (MISCELLANEOUS) IMPLANT
KIT BALLN UROMAX 26 75X4 (MISCELLANEOUS)
LASER FIBER DISP (UROLOGICAL SUPPLIES) IMPLANT
LASER FIBER DISP 1000U (UROLOGICAL SUPPLIES) IMPLANT
OMNIPAQUE IMPLANT
PACK CYSTOSCOPY (CUSTOM PROCEDURE TRAY) ×2 IMPLANT
SET HIGH PRES BAL DIL (LABEL)
SHEATH ACCESS URETERAL 38CM (SHEATH) ×1 IMPLANT
SHEATH URET ACCESS 12FR/35CM (UROLOGICAL SUPPLIES) IMPLANT
SHEATH URET ACCESS 12FR/55CM (UROLOGICAL SUPPLIES) IMPLANT
STENT URET 6FRX26 CONTOUR (STENTS) ×1 IMPLANT

## 2011-11-04 NOTE — H&P (View-Only) (Signed)
Problems Problems  1. Nephrolithiasis Of The Right Kidney 592.0  History of Present Illness  Andrew Patel is a 41 yo WM sent in consultation by Dr. Lysbeth Galas for a 12mm right UPJ stone with obstruction.  He had the onset in 2010 of right flank pain.  He has had low back pain since that time and had an evaluation that revealed a small disc protrusion.  The pain got worse last week and he went to [facility] ER and a CT was done that showed the right sided stone.  I don't have a disc of the scan so I can't tell the density of the stone and his KUB today still has GI contrast over the right renal shadow.   He continues to have pain with some anorexia.  He has had no gross hematuria.  He has seen occasional blood in the stool.  He has a weak urinary stream with some post void dribbling.   Past Medical History Problems  1. History of  Lumbar Disc Degeneration 722.52  Surgical History Problems  1. History of  Arthroscopy Knee Left 2. History of  Arthroscopy Knee Right 3. History of  Inguinal Hernia Repair Right 4. History of  Knee Surgery Right 5. History of  Laminectomy Lumbar  Current Meds 1. Biotin 10 MG Oral Tablet; Therapy: 24May2013 to 2. HYDROmorphone HCl 2 MG Oral Tablet; Therapy: (Recorded:24May2013) to 3. Multi-Vitamin TABS; Therapy: (Recorded:24May2013) to  Allergies Medication  1. Codeine Derivatives  Family History Problems  1. Family history of  Nephrolithiasis  Social History Problems    Caffeine Use   Marital History - Currently Married   Never A Smoker   Occupation: Denied    History of  Alcohol Use  Review of Systems Genitourinary, constitutional, skin, eye, otolaryngeal, hematologic/lymphatic, cardiovascular, pulmonary, endocrine, musculoskeletal, gastrointestinal, neurological and psychiatric system(s) were reviewed and pertinent findings if present are noted.  Genitourinary: urinary frequency, feelings of urinary urgency, difficulty starting the urinary stream,  weak urinary stream and post-void dribbling.  Gastrointestinal: abdominal pain.  Constitutional: night sweats, feeling tired (fatigue) and recent weight loss.  Integumentary: skin rash/lesion and pruritus.  Eyes: blurred vision.  ENT: sinus problems.  Hematologic/Lymphatic: a tendency to easily bruise.  Musculoskeletal: back pain and joint pain.  Neurological: dizziness.  Psychiatric: anxiety.    Vitals Vital Signs [Data Includes: Last 1 Day]  24May2013 01:42PM  BMI Calculated: 22.21 BSA Calculated: 1.96 Height: 6 ft  Weight: 164 lb  Blood Pressure: 115 / 70 Temperature: 97.6 F Heart Rate: 82  Physical Exam Constitutional: Well nourished and well developed . No acute distress.  ENT:. The ears and nose are normal in appearance.  Neck: The appearance of the neck is normal and no neck mass is present.  Pulmonary: No respiratory distress and normal respiratory rhythm and effort.  Cardiovascular: Heart rate and rhythm are normal . No peripheral edema.  Abdomen: The abdomen is soft and nontender. No masses are palpated. moderate right CVA tenderness, but no left CVA tenderness. No hepatosplenomegaly noted.  Skin: Normal skin turgor, no visible rash and no visible skin lesions.  Neuro/Psych:. Mood and affect are appropriate.    Results/Data Urine [Data Includes: Last 1 Day]   24May2013  COLOR YELLOW   APPEARANCE CLEAR   SPECIFIC GRAVITY 1.025   pH 6.0   GLUCOSE NEG mg/dL  BILIRUBIN NEG   KETONE NEG mg/dL  BLOOD SMALL   PROTEIN TRACE mg/dL  UROBILINOGEN 0.2 mg/dL  NITRITE NEG   LEUKOCYTE ESTERASE NEG  SQUAMOUS EPITHELIAL/HPF NONE SEEN   WBC 0-3 WBC/hpf  RBC 7-10 RBC/hpf  BACTERIA NONE SEEN   CRYSTALS NONE SEEN   CASTS NONE SEEN   Other MUCUS    Old records or history reviewed: I have reviewed records from Dr. Lysbeth Galas including the CT report.  The following images/tracing/specimen were independently visualized:  KUB today shows retained GI contrast that obscures the  right kidney. He has lumbar ray cages but no other abnormalities. I have reviewed his prior films in [place] but he only has lumbar images that don't include the kidneys.  Will request old records/history: I have requested the CT films from [facility].    Assessment Assessed  1. Nephrolithiasis Of The Right Kidney 592.0   He has a 12mm RUPJ stone and I don't know if it is radiopaque or not.   Plan Health Maintenance (V70.0)  1. UA With REFLEX  Done: 24May2013 01:33PM Nephrolithiasis Of The Right Kidney (592.0)  2. HYDROmorphone HCl 2 MG Oral Tablet; One to two po q 4-6 hours prn pain; Last Rx:24May2013 3. Follow-up Office  Follow-up  Requested for: 28May2013 4. Follow-up Schedule Surgery Office  Follow-up  Requested for: 24May2013 5. Get Outside Records Office  Follow-up  Requested for: 24May2013 Unlinked  6. KUB  Done: 24May2013 12:00AM   I am going to refill his Dilaudid. He will do a fleets enema to try to clear the colon contrast. He will return on Tuesday for a KUB to see if the stone is visible. I will get the CT films. I discussed the options of ESWL, PCNL and ureteroscopy and believe the ESWL will be most appropriate if the stone is visible.  I reviewed the risks of bleeding, infection, renal and adjacent organ that can rarely be life threatening, failure of fragmentation, need for secondary procedures, thrombotic events and sedation risks.

## 2011-11-04 NOTE — Anesthesia Procedure Notes (Signed)
Procedure Name: LMA Insertion Date/Time: 11/04/2011 10:01 AM Performed by: Jessica Priest Pre-anesthesia Checklist: Patient identified, Emergency Drugs available, Suction available and Patient being monitored Patient Re-evaluated:Patient Re-evaluated prior to inductionOxygen Delivery Method: Circle System Utilized Preoxygenation: Pre-oxygenation with 100% oxygen Intubation Type: IV induction Ventilation: Mask ventilation without difficulty LMA: LMA inserted LMA Size: 4.0 Number of attempts: 1 Airway Equipment and Method: bite block Placement Confirmation: positive ETCO2 Tube secured with: Tape Dental Injury: Teeth and Oropharynx as per pre-operative assessment

## 2011-11-04 NOTE — OR Nursing (Signed)
Right ureteral stone taking by Dr. Wrenn. 

## 2011-11-04 NOTE — Anesthesia Postprocedure Evaluation (Signed)
  Anesthesia Post-op Note  Patient: Andrew Patel  Procedure(s) Performed: Procedure(s) (LRB): URETEROSCOPY (Right) HOLMIUM LASER APPLICATION (Right) CYSTOSCOPY WITH STENT REPLACEMENT (Right)  Patient Location: PACU  Anesthesia Type: General  Level of Consciousness: awake and alert   Airway and Oxygen Therapy: Patient Spontanous Breathing  Post-op Pain: mild  Post-op Assessment: Post-op Vital signs reviewed, Patient's Cardiovascular Status Stable, Respiratory Function Stable, Patent Airway and No signs of Nausea or vomiting  Post-op Vital Signs: stable  Complications: No apparent anesthesia complications 

## 2011-11-04 NOTE — Anesthesia Preprocedure Evaluation (Signed)
Anesthesia Evaluation  Patient identified by MRN, date of birth, ID band Patient awake    Reviewed: Allergy & Precautions, H&P , NPO status , Patient's Chart, lab work & pertinent test results, reviewed documented beta blocker date and time   History of Anesthesia Complications (+) PONV  Airway Mallampati: II TM Distance: >3 FB Neck ROM: Full    Dental  (+) Teeth Intact and Dental Advisory Given   Pulmonary neg pulmonary ROS,  breath sounds clear to auscultation        Cardiovascular negative cardio ROS  Rhythm:Regular Rate:Normal     Neuro/Psych negative neurological ROS  negative psych ROS   GI/Hepatic negative GI ROS, Neg liver ROS,   Endo/Other  negative endocrine ROS  Renal/GU Kidney stone  negative genitourinary   Musculoskeletal negative musculoskeletal ROS (+)   Abdominal   Peds negative pediatric ROS (+)  Hematology negative hematology ROS (+)   Anesthesia Other Findings   Reproductive/Obstetrics negative OB ROS                           Anesthesia Physical Anesthesia Plan  ASA: I  Anesthesia Plan: General   Post-op Pain Management:    Induction: Intravenous  Airway Management Planned: LMA  Additional Equipment:   Intra-op Plan:   Post-operative Plan: Extubation in OR  Informed Consent: I have reviewed the patients History and Physical, chart, labs and discussed the procedure including the risks, benefits and alternatives for the proposed anesthesia with the patient or authorized representative who has indicated his/her understanding and acceptance.   Dental advisory given  Plan Discussed with: CRNA and Surgeon  Anesthesia Plan Comments:         Anesthesia Quick Evaluation

## 2011-11-04 NOTE — Discharge Instructions (Addendum)
Ureteral Stent A ureteral stent is a soft plastic tube with multiple holes. The stent is inserted into a ureter to help drain urine from the kidney into the bladder. The tube has a coil on each end to keep it from falling out. One end stays in the kidney. The other end stays in the bladder. A stent cannot be seen from the outside. Usually it does not keep you from going about normal routines. A ureteral stent is used to bypass a blockage in your kidney or ureter. This blockage can be caused by kidney stones, scar tissue, pregnancy, or other causes. It can also be used during treatment to remove a kidney stone or to let a ureter heal after surgery. The stent allows urine to drain from the kidney into the bladder. It is most often taken out after the blockage has been removed or the ureter has healed. If a stent is needed for a long time, it will be changed every few months. INSERTING THE STENT Your stent is put in by a urologist. This is a medical doctor trained for treating genitourinary (kidney, ureter and bladder) problems. Before your stent is put in, your caregiver may order x-rays or other imaging tests of your kidneys and ureters. The stent is inserted in a hospital or same day surgical center. You can anticipate going home the same day. PROCEDURE  A special x-ray machine called a fluoroscope is used to guide the insertion of your stent. This allows your doctor to make sure the stent is in the correct place.   First you are given anesthesia to keep you comfortable.   Then your doctor inserts a special lighted instrument called a cystoscope into your bladder. This allows your doctor to see the opening to the ureter.   A thin wire is carefully threaded into the bladder and up the ureter. The stent is inserted over the wire and the wire is then removed.  HOME CARE INSTRUCTIONS   While the stent is in place, you may feel some discomfort. Certain movements may trigger pain or a feeling that you need  to urinate. Your caregiver may give you pain medication. Only take over-the-counter or prescription medicines for pain, discomfort, or fever as directed by your caregiver. Do not take aspirin as this can make bleeding worse.   You may be given medications to prevent infection or bladder spasms. Be sure to take all medications as directed.   Drink plenty of fluids.   You may have small amounts of bleeding causing your urine to be slightly red. This is nothing to be concerned about.  REMOVAL OF THE STENT Your stent is left in until the blockage is resolved. This may take two weeks or longer. Before the stent is removed, you may have an x-ray make sure the ureter is open. The stent can be removed by your caregiver in the office. Medications may be given for comfort. Be sure to keep all follow-up appointments so your caregiver can check that you are healing properly. SEEK IMMEDIATE MEDICAL CARE IF:   Your urine is dark red or has blood clots.   You are incontinent (leaking urine).   You have an oral temperature above 102 F (38.9 C), chills, nausea (feeling sick to your stomach), or vomiting.   Your pain is not relieved by pain medication. Do not take aspirin as this can make bleeding worse.   The end of the stent comes out of the urethra.  Document Released: 04/30/2000 Document Revised:   04/22/2011 Document Reviewed: 04/29/2008 [facility] Patient Information 2012 [place], [place].  You can pull the stent by the attached string on Monday morning.   If you start to leak urine, the stent is probably dislodged and should be removed at that time Post Anesthesia Home Care Instructions  Activity: Get plenty of rest for the remainder of the day. A responsible adult should stay with you for 24 hours following the procedure.  For the next 24 hours, DO NOT: -Drive a car -Advertising copywriter -Drink alcoholic beverages -Take any medication unless instructed by your physician -Make any legal decisions or  sign important papers.  Meals: Start with liquid foods such as gelatin or soup. Progress to regular foods as tolerated. Avoid greasy, spicy, heavy foods. If nausea and/or vomiting occur, drink only clear liquids until the nausea and/or vomiting subsides. Call your physician if vomiting continues.  Special Instructions/Symptoms: Your throat may feel dry or sore from the anesthesia or the breathing tube placed in your throat during surgery. If this causes discomfort, gargle with warm salt water. The discomfort should disappear within 24 hours.  Marland Kitchen

## 2011-11-04 NOTE — Anesthesia Postprocedure Evaluation (Signed)
Immediate Anesthesia Transfer of Care Note  Patient: Andrew Patel  Procedure(s) Performed: Procedure(s) (LRB): URETEROSCOPY (Right) HOLMIUM LASER APPLICATION (Right) CYSTOSCOPY WITH STENT REPLACEMENT (Right)  Patient Location: PACU  Anesthesia Type: General  Level of Consciousness: awake, sedated, patient cooperative and responds to stimulation  Airway & Oxygen Therapy: Patient Spontanous Breathing and Patient connected to face mask oxygen  Post-op Assessment: Report given to PACU RN, Post -op Vital signs reviewed and stable and Patient moving all extremities  Post vital signs: Reviewed and stable  Complications: No apparent anesthesia complications

## 2011-11-04 NOTE — Anesthesia Postprocedure Evaluation (Deleted)
  Anesthesia Post-op Note  Patient: Andrew Patel  Procedure(s) Performed: Procedure(s) (LRB): URETEROSCOPY (Right) HOLMIUM LASER APPLICATION (Right) CYSTOSCOPY WITH STENT REPLACEMENT (Right)  Patient Location: PACU  Anesthesia Type: General  Level of Consciousness: awake and alert   Airway and Oxygen Therapy: Patient Spontanous Breathing  Post-op Pain: mild  Post-op Assessment: Post-op Vital signs reviewed, Patient's Cardiovascular Status Stable, Respiratory Function Stable, Patent Airway and No signs of Nausea or vomiting  Post-op Vital Signs: stable  Complications: No apparent anesthesia complications

## 2011-11-04 NOTE — Brief Op Note (Signed)
11/04/2011  11:27 AM  PATIENT:  Rei L Friscia  41 y.o. male  PRE-OPERATIVE DIAGNOSIS:  RIGHT URETHRAL STONES  POST-OPERATIVE DIAGNOSIS:  RIGHT URETHRAL STONES  PROCEDURE:  Procedure(s) (LRB): URETEROSCOPY (Right) HOLMIUM LASER APPLICATION (Right) CYSTOSCOPY WITH STENT REPLACEMENT (Right)  SURGEON:  Surgeon(s) and Role:    * Anner Crete, MD - Primary  PHYSICIAN ASSISTANT:   ASSISTANTS: none   ANESTHESIA:   general  EBL:  Total I/O In: 1000 [I.V.:1000] Out: -   BLOOD ADMINISTERED:none  DRAINS: 6x26 right JJ stent with string   LOCAL MEDICATIONS USED:  LIDOCAINE   SPECIMEN:  Source of Specimen:  stone fragments  DISPOSITION OF SPECIMEN:  Give to patient  COUNTS:  YES  TOURNIQUET:  * No tourniquets in log *  DICTATION: .Other Dictation: Dictation Number [id number]  PLAN OF CARE: Discharge to home after PACU  PATIENT DISPOSITION:  PACU - hemodynamically stable.   Delay start of Pharmacological VTE agent (>24hrs) due to surgical blood loss or risk of bleeding: no

## 2011-11-04 NOTE — Interval H&P Note (Signed)
History and Physical Interval Note:  11/04/2011 9:50 AM  Andrew Patel  has presented today for surgery, with the diagnosis of RIGHT URETERAL STONES  The various methods of treatment have been discussed with the patient and family. After consideration of risks, benefits and other options for treatment, the patient has consented to  Procedure(s) (LRB): URETEROSCOPY (Right) HOLMIUM LASER APPLICATION (Right) as a surgical intervention .  The patient's history has been reviewed, patient examined, no change in status, stable for surgery.  I have reviewed the patients' chart and labs.  Questions were answered to the patient's satisfaction.     [name]

## 2011-11-05 ENCOUNTER — Encounter (HOSPITAL_BASED_OUTPATIENT_CLINIC_OR_DEPARTMENT_OTHER): Payer: Self-pay | Admitting: Urology

## 2011-11-05 NOTE — Op Note (Signed)
NAMECOLE, EASTRIDGE                  ACCOUNT NO.:  [id number]  MEDICAL RECORD NO.:  [id number]  LOCATION:  [facility]                         FACILITY:  [facility]  PHYSICIAN:  Excell Seltzer. Annabell Howells, M.D.    DATE OF BIRTH:  Jan 20, 1971  DATE OF PROCEDURE:  11/04/2011 DATE OF DISCHARGE:                              OPERATIVE REPORT   PROCEDURE:  Cystoscopy, right ureteroscopy with holmium laser destruction of stones, stone removal, and placement of right double-J stent.  PREOPERATIVE DIAGNOSIS:  Retained right ureteral fragments post lithotripsy.  POSTOPERATIVE DIAGNOSIS:  Retained right ureteral fragments post lithotripsy.  SURGEON:  Excell Seltzer. Annabell Howells, MD  ANESTHESIA:  General.  SPECIMEN:  Stone fragments.  DRAINS:  A 6-French 26-cm double J-stent.  ESTIMATED BLOOD LOSS:  Minimal.  COMPLICATIONS:  None.  INDICATIONS:  Rajesh is a 41 year old white male who originally underwent a right renal lithotripsy for approximately 1.5 cm stone about 3 weeks ago.  Postoperatively, he developed a steinstrasse in the distal ureter, but was also noted to have some large residual fragments in the proximal ureter.  He initially underwent placement of a stent and partial debulking of the steinstrasse, but it was felt an additional ureteroscopy would be required to complete the procedure.  FINDINGS OF PROCEDURES:  He was given Cipro.  He was taken to operating room, where general anesthetic was induced.  He was placed in lithotomy position.  His perineum and genitalia were prepped with Betadine solution.  He was draped in usual sterile fashion.  Cystoscopy was performed using a 22-French scope and 12-degree lens.  Examination revealed a normal urethra.  The external sphincter was intact.  The prostatic urethra was short without obstruction. Examination of the bladder revealed smooth wall without tumors or stones.  The left ureteral orifice was unremarkable.  The right ureteral orifice had some edema and  erythema with a stent at the meatus.  The stent was grasped and pulled the urethral meatus.  A wire was then passed through the stent and the stent was removed.  A 6.4-French short ureteroscope was then passed alongside the wire into the ureter.  Several fragments were noted in the distal and mid ureter. These were small enough to be removed with the basket.  Once all of these fragments had been removed, a 38 cm digital access sheath was placed over the wire and the inner core and wire were removed.  A digital flexible ureteroscope was then passed to the level of the stones in the proximal ureter.  These stones were too large to be removed intact, so a 220 micron laser fiber was engaged with the power set on 0.5 and a frequency on 20 Hz.  The stones were broken into manageable fragments, which were then removed with the basket.  I then advanced the scope into the renal collecting system where he was noted to have some fragments on KUB.  There were a few 4-5 mm fragments identified, these were engaged with the laser and broken into smaller pieces.  Some of the larger fragments were then removed, but the smaller 1 and 2 mm fragments could not be engaged with the basket and were felt to  be sufficiently small to pass on their own.  The entire kidney was inspected, no large residual fragments were identified.  At this point, a guidewire was passed through the ureteroscope and ureteroscope was backed out.  A few small 1-2 mm fragments were noted in the ureter, but these were felt to be sufficiently small to pass.  There were some mucosal injury at the level of the proximal stone treatment site, so it was felt that replacement of the stent was indicated.  Once the ureteroscope and access sheath were removed, the cystoscope was reinserted over the wire and a 6-French 26 cm double-J stent was then advanced over the wire to the kidney under fluoroscopic guidance.  The wire was removed leaving  a good coil in the kidney, a good coil in the bladder.  The bladder was drained and the stent string was left exiting urethra.  The urethra was instilled with 10 mL of 2% lidocaine jelly and a B and O suppository was placed.  The stent string was secured to the patient's penis.  His drapes were removed.  He was taken down from lithotomy position.  His anesthetic was reversed.  He was moved to recovery room in stable condition.  There were no complications.     Excell Seltzer. Annabell Howells, M.D.     JJW/MEDQ  D:  11/04/2011  T:  11/04/2011  Job:  [id number]

## 2011-11-05 NOTE — Op Note (Deleted)
NAME:  Andrew Patel, Andrew Patel                  ACCOUNT NO.:  [id number]  MEDICAL RECORD NO.:  [id number]  LOCATION:  [facility]                         FACILITY:  [facility]  PHYSICIAN:  Siobhan Zaro J. Mackensi Mahadeo, M.D.    DATE OF BIRTH:  11/21/1970  DATE OF PROCEDURE:  11/04/2011 DATE OF DISCHARGE:                              OPERATIVE REPORT   PROCEDURE:  Cystoscopy, right ureteroscopy with holmium laser destruction of stones, stone removal, and placement of right double-J stent.  PREOPERATIVE DIAGNOSIS:  Retained right ureteral fragments post lithotripsy.  POSTOPERATIVE DIAGNOSIS:  Retained right ureteral fragments post lithotripsy.  SURGEON:  Danitra Payano J. Haly Feher, MD  ANESTHESIA:  General.  SPECIMEN:  Stone fragments.  DRAINS:  A 6-French 26-cm double J-stent.  ESTIMATED BLOOD LOSS:  Minimal.  COMPLICATIONS:  None.  INDICATIONS:  Andrew Patel is a 41-year-old white male who originally underwent a right renal lithotripsy for approximately 1.5 cm stone about 3 weeks ago.  Postoperatively, he developed a steinstrasse in the distal ureter, but was also noted to have some large residual fragments in the proximal ureter.  He initially underwent placement of a stent and partial debulking of the steinstrasse, but it was felt an additional ureteroscopy would be required to complete the procedure.  FINDINGS OF PROCEDURES:  He was given Cipro.  He was taken to operating room, where general anesthetic was induced.  He was placed in lithotomy position.  His perineum and genitalia were prepped with Betadine solution.  He was draped in usual sterile fashion.  Cystoscopy was performed using a 22-French scope and 12-degree lens.  Examination revealed a normal urethra.  The external sphincter was intact.  The prostatic urethra was short without obstruction. Examination of the bladder revealed smooth wall without tumors or stones.  The left ureteral orifice was unremarkable.  The right ureteral orifice had some edema and  erythema with a stent at the meatus.  The stent was grasped and pulled the urethral meatus.  A wire was then passed through the stent and the stent was removed.  A 6.4-French short ureteroscope was then passed alongside the wire into the ureter.  Several fragments were noted in the distal and mid ureter. These were small enough to be removed with the basket.  Once all of these fragments had been removed, a 38 cm digital access sheath was placed over the wire and the inner core and wire were removed.  A digital flexible ureteroscope was then passed to the level of the stones in the proximal ureter.  These stones were too large to be removed intact, so a 220 micron laser fiber was engaged with the power set on 0.5 and a frequency on 20 Hz.  The stones were broken into manageable fragments, which were then removed with the basket.  I then advanced the scope into the renal collecting system where he was noted to have some fragments on KUB.  There were a few 4-5 mm fragments identified, these were engaged with the laser and broken into smaller pieces.  Some of the larger fragments were then removed, but the smaller 1 and 2 mm fragments could not be engaged with the basket and were felt to   be sufficiently small to pass on their own.  The entire kidney was inspected, no large residual fragments were identified.  At this point, a guidewire was passed through the ureteroscope and ureteroscope was backed out.  A few small 1-2 mm fragments were noted in the ureter, but these were felt to be sufficiently small to pass.  There were some mucosal injury at the level of the proximal stone treatment site, so it was felt that replacement of the stent was indicated.  Once the ureteroscope and access sheath were removed, the cystoscope was reinserted over the wire and a 6-French 26 cm double-J stent was then advanced over the wire to the kidney under fluoroscopic guidance.  The wire was removed leaving  a good coil in the kidney, a good coil in the bladder.  The bladder was drained and the stent string was left exiting urethra.  The urethra was instilled with 10 mL of 2% lidocaine jelly and a B and O suppository was placed.  The stent string was secured to the patient's penis.  His drapes were removed.  He was taken down from lithotomy position.  His anesthetic was reversed.  He was moved to recovery room in stable condition.  There were no complications.     Andrew Patel, M.D.     JJW/MEDQ  D:  11/04/2011  T:  11/04/2011  Job:  [id number] 

## 2011-11-05 NOTE — Anesthesia Postprocedure Evaluation (Signed)
  Immediate Anesthesia Transfer of Care Note  Patient: Andrew Patel  Procedure(s) Performed: * No surgery found *  Patient Location: PACU  Anesthesia Type: General  Level of Consciousness: awake, sedated, patient cooperative and responds to stimulation  Airway & Oxygen Therapy: Patient Spontanous Breathing and Patient connected to face mask oxygen  Post-op Assessment: Report given to PACU RN, Post -op Vital signs reviewed and stable and Patient moving all extremities  Post vital signs: Reviewed and stable  Complications: No apparent anesthesia complications

## 2012-09-22 ENCOUNTER — Other Ambulatory Visit: Payer: Self-pay | Admitting: Neurosurgery

## 2012-09-22 DIAGNOSIS — M549 Dorsalgia, unspecified: Secondary | ICD-10-CM

## 2012-09-29 ENCOUNTER — Other Ambulatory Visit: Payer: Medicare Other

## 2014-12-16 ENCOUNTER — Other Ambulatory Visit: Payer: Self-pay | Admitting: Orthopaedic Surgery

## 2014-12-16 DIAGNOSIS — M25511 Pain in right shoulder: Secondary | ICD-10-CM

## 2014-12-31 ENCOUNTER — Other Ambulatory Visit: Payer: Medicare Other

## 2015-01-02 ENCOUNTER — Ambulatory Visit
Admission: RE | Admit: 2015-01-02 | Discharge: 2015-01-02 | Disposition: A | Discharge status: Home / Self Care | Payer: BLUE CROSS/BLUE SHIELD | Source: Ambulatory Visit | Attending: Orthopaedic Surgery | Admitting: Orthopaedic Surgery

## 2015-01-02 DIAGNOSIS — M25511 Pain in right shoulder: Secondary | ICD-10-CM

## 2015-01-02 MED ORDER — IOHEXOL 180 MG/ML  SOLN
15.0000 mL | Freq: Once | INTRAMUSCULAR | Status: DC | PRN
  Administered 2015-01-02: 15 mL via INTRA_ARTICULAR

## 2015-01-17 ENCOUNTER — Other Ambulatory Visit: Payer: Self-pay | Admitting: Orthopaedic Surgery

## 2015-01-17 DIAGNOSIS — M25562 Pain in left knee: Secondary | ICD-10-CM

## 2015-01-22 ENCOUNTER — Ambulatory Visit
Admission: RE | Admit: 2015-01-22 | Discharge: 2015-01-22 | Disposition: A | Discharge status: Home / Self Care | Payer: BLUE CROSS/BLUE SHIELD | Source: Ambulatory Visit | Attending: Orthopaedic Surgery | Admitting: Orthopaedic Surgery

## 2015-01-22 DIAGNOSIS — M25562 Pain in left knee: Secondary | ICD-10-CM

## 2016-04-27 ENCOUNTER — Encounter (INDEPENDENT_AMBULATORY_CARE_PROVIDER_SITE_OTHER): Payer: Self-pay | Admitting: Orthopaedic Surgery

## 2016-04-27 ENCOUNTER — Ambulatory Visit (INDEPENDENT_AMBULATORY_CARE_PROVIDER_SITE_OTHER): Payer: BLUE CROSS/BLUE SHIELD | Admitting: Orthopaedic Surgery

## 2016-04-27 ENCOUNTER — Ambulatory Visit (INDEPENDENT_AMBULATORY_CARE_PROVIDER_SITE_OTHER): Payer: BLUE CROSS/BLUE SHIELD

## 2016-04-27 VITALS — BP 137/91 | HR 88 | Ht 72.0 in | Wt 174.0 lb

## 2016-04-27 DIAGNOSIS — G8929 Other chronic pain: Secondary | ICD-10-CM | POA: Diagnosis not present

## 2016-04-27 DIAGNOSIS — M25561 Pain in right knee: Secondary | ICD-10-CM | POA: Diagnosis not present

## 2016-04-27 MED ORDER — BUPIVACAINE HCL 0.5 % IJ SOLN
3.0000 mL | INTRAMUSCULAR | Status: AC | PRN
  Administered 2016-04-27: 3 mL via INTRA_ARTICULAR

## 2016-04-27 MED ORDER — METHYLPREDNISOLONE ACETATE 40 MG/ML IJ SUSP
80.0000 mg | INTRAMUSCULAR | Status: AC | PRN
  Administered 2016-04-27: 80 mg

## 2016-04-27 MED ORDER — LIDOCAINE HCL 1 % IJ SOLN
5.0000 mL | INTRAMUSCULAR | Status: AC | PRN
  Administered 2016-04-27: 5 mL

## 2016-04-27 NOTE — Progress Notes (Signed)
Office Visit Note   Patient: Andrew Patel           Date of Birth: 1970-07-04           MRN: [id number] Visit Date: 04/27/2016              Requested by: No referring provider defined for this encounter. 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   Follow-Up Instructions: No Follow-up on file.   Orders:  No orders of the defined types were placed in this encounter.  No orders of the defined types were placed in this encounter.     Procedures: Large Joint Inj Date/Time: 04/27/2016 11:43 AM Performed by: Valeria BatmanWHITFIELD, Payson Evrard W Authorized by: Valeria BatmanWHITFIELD, Maelys Kinnick W   Consent Given by:  Patient Timeout: prior to procedure the correct patient, procedure, and site was verified   Indications:  Pain and joint swelling Location:  Knee Site:  R knee Prep: patient was prepped and draped in usual sterile fashion   Needle Size:  25 G Needle Length:  1.5 inches Approach:  Anteromedial Ultrasound Guidance: No   Fluoroscopic Guidance: No   Arthrogram: No   Medications:  5 mL lidocaine 1 %; 80 mg methylPREDNISolone acetate 40 MG/ML; 3 mL bupivacaine 0.5 % Aspiration Attempted: No   Patient tolerance:  Patient tolerated the procedure well with no immediate complications     Clinical Data: No additional findings.   Subjective: Chief Complaint  Patient presents with  . Right Knee - Pain    Pt playing in snow with kids and fell going up a hill on Saturday 04/24/16 and injured Right knee.  Pt has had 5 right knee surgeries on his Right knee   [name] did not experience immediate onset of pain after playing with his children in the snow. He noted progressive pain over several hours particularly in the popliteal space. There's been no ecchymosis or erythema. Denies  numbness or tingling. He has not had a feeling of instability. Review of Systems   Objective: Vital Signs: Ht 6' (1.829 m)   Wt 174 lb (78.9 kg)   BMI 23.60 kg/m   Physical Exam  Ortho Exam right knee exam reveals minimal effusion. The knee was not hot warm or red. There was no instability with varus and valgus stress. Negative anterior drawer sign. All prior incisions are nicely healed. I did not palpate pain beneath the patella no was or any patellar crepitation. There was minimal medial lateral joint discomfort. I did not feel any mass in the popliteal space. No pain with range of motion of either hip. No swelling distally neuro vascular exam was intact.  Specialty Comments:  No specialty comments available.  Imaging: No results found.   PMFS History: There are no active problems to display for this patient.  Past Medical History:  Diagnosis Date  . Frequency of urination   . PONV (postoperative nausea and vomiting)   . Right ureteral stone     No family history on file.  Past Surgical History:  Procedure Laterality Date  . APPENDECTOMY  AGE 5  . CYSTO/ RIGHT URETEROSCOPY LASER LITHOTRIPSY/ STONE EXTRACTIONS/ RIGHT STENT PLACEMENT  10-19-2011  . CYSTOSCOPY W/ URETERAL STENT PLACEMENT  11/04/2011   Procedure: CYSTOSCOPY WITH STENT REPLACEMENT;  Surgeon: Anner CreteJohn J Wrenn, MD;  Location: [facility];  Service: Urology;  Laterality: Right;  . EXTRACORPOREAL SHOCK WAVE LITHOTRIPSY  10-14-2011   RIGHT  . INGUINAL HERNIA REPAIR  01-13-2000   RIGHT  . KNEE ARTHROSCOPY W/ MENISCECTOMY  2003   LEFT KNEE  . LUMBAR DISKECTOMY/ FUSION  2000   L5 - S1  . RIGHT KNEE ARTHROSCOPY   X5  LAST ONE 2004  . URETEROSCOPY  11/04/2011   Procedure: URETEROSCOPY;  Surgeon: Anner CreteJohn J Wrenn, MD;  [facility] [place];  Service: Urology;  Laterality: Right;  [facility]OLMI[facility] LASER   Social History   Occupational History  . Not on file.   Social History Main Topics  .  Smoking status: Never Smoker  . Smokeless tobacco: Never Used  . Alcohol use Yes     Comment: SOCIALLY  . Drug use: No  . Sexual activity: Not on file

## 2016-04-28 ENCOUNTER — Ambulatory Visit (INDEPENDENT_AMBULATORY_CARE_PROVIDER_SITE_OTHER): Payer: Self-pay | Admitting: Orthopedic Surgery

## 2016-05-27 ENCOUNTER — Ambulatory Visit (INDEPENDENT_AMBULATORY_CARE_PROVIDER_SITE_OTHER): Payer: BLUE CROSS/BLUE SHIELD | Admitting: Orthopaedic Surgery

## 2016-05-27 ENCOUNTER — Encounter (INDEPENDENT_AMBULATORY_CARE_PROVIDER_SITE_OTHER): Payer: Self-pay | Admitting: Orthopaedic Surgery

## 2016-05-27 VITALS — Ht 72.0 in | Wt 175.0 lb

## 2016-05-27 DIAGNOSIS — M25561 Pain in right knee: Secondary | ICD-10-CM | POA: Diagnosis not present

## 2016-05-27 DIAGNOSIS — G8929 Other chronic pain: Secondary | ICD-10-CM

## 2016-05-27 NOTE — Progress Notes (Signed)
   Office Visit Note   Patient: Andrew Patel           Date of Birth: November 15, 1970           MRN: [id number] Visit Date: 05/27/2016              Requested by: Joette CatchingLeonard Nyland, MD [address] [place], [place] [id number] PCP: Josue HectorNYLAND,LEONARD ROBERT, MD   Assessment & Plan: Visit Diagnoses: chronic right knee pain after injury in December. Possibilities include tear of the lateral meniscus. Tricompartmental osteoarthritis. Did not have much response to intra-articular cortisone injection  Plan: MRI scan right knee with MARS technique. Patient is approximately 20 years status post anterior cruciate ligament reconstruction with metallic interference screws.  Follow-Up Instructions: No Follow-up on file.   Orders:  No orders of the defined types were placed in this encounter.  No orders of the defined types were placed in this encounter.     Procedures: No procedures performed   Clinical Data: No additional findings.   Subjective: No chief complaint on file.   Pt here for continuing Right knee pain. Pt received a cortisone shot last visit 04/27/16 but no relief.    Review of Systems   Objective: Vital Signs: There were no vitals taken for this visit.  Physical Exam  Ortho Exam right knee exam without evidence of instability. Negative anterior drawer sign. Negative Lachman's test. No effusion. Mild patellar crepitation without pain. No palpable tenderness in popliteal space. No masses. No swelling distally. Neurovascular exam intact. No opening with a varus or valgus stress.. Popping or clicking along either medial or lateral joint. Skin is intact without evidence of ecchymosis or erythema. No pain with range of motion of right hip. Straight leg raise negative.  Specialty Comments:  No specialty comments available.  Imaging: No results found.   PMFS History: There are no active problems to display for this patient.  Past Medical History:  Diagnosis Date  .  Frequency of urination   . PONV (postoperative nausea and vomiting)   . Right ureteral stone     No family history on file.  Past Surgical History:  Procedure Laterality Date  . APPENDECTOMY  AGE 51  . CYSTO/ RIGHT URETEROSCOPY LASER LITHOTRIPSY/ STONE EXTRACTIONS/ RIGHT STENT PLACEMENT  10-19-2011  . CYSTOSCOPY W/ URETERAL STENT PLACEMENT  11/04/2011   Procedure: CYSTOSCOPY WITH STENT REPLACEMENT;  Surgeon: Anner CreteJohn J Wrenn, MD;  [facility] [place];  Service: Urology;  Laterality: Right;  . EXTRACORPOREAL SHOCK WAVE LITHOTRIPSY  10-14-2011   RIGHT  . INGUINAL HERNIA REPAIR  01-13-2000   RIGHT  . KNEE ARTHROSCOPY W/ MENISCECTOMY  2003   LEFT KNEE  . LUMBAR DISKECTOMY/ FUSION  2000   L5 - S1  . RIGHT KNEE ARTHROSCOPY   X5  LAST ONE 2004  . URETEROSCOPY  11/04/2011   Procedure: URETEROSCOPY;  Surgeon: Anner CreteJohn J Wrenn, MD;  [facility] [place];  Service: Urology;  Laterality: Right;  C[facility] HOLMIUM LASER   Social History   Occupational History  . Not on file.   Social History Main Topics  . Smoking status: Never Smoker  . Smokeless tobacco: Never Used  . Alcohol use Yes     Comment: SOCIALLY  . Drug use: No  . Sexual activity: Not on file

## 2016-06-27 ENCOUNTER — Inpatient Hospital Stay: Admission: RE | Admit: 2016-06-27 | Payer: BLUE CROSS/BLUE SHIELD | Source: Ambulatory Visit

## 2016-10-12 IMAGING — MR MR SHOULDER*R* W/CM
4 of 6 series · 18 of 40 positions shown · IV contrast (agent unspecified)
Comparison: None.

CLINICAL DATA: Right shoulder pain since May 2010.

EXAM:
MR ARTHROGRAM OF THE RIGHT SHOULDER
TECHNIQUE: Multiplanar, multisequence MR imaging of the right shoulder was
performed following the administration of intra-articular contrast.
CONTRAST:  See Injection Documentation.

[Series 2: T1 fat-sat · axial · 4.0mm · 0.22mm/px · z∈[-29,+53]mm · 5 of 22 slices shown (1 of 2)]
[im 1/22]
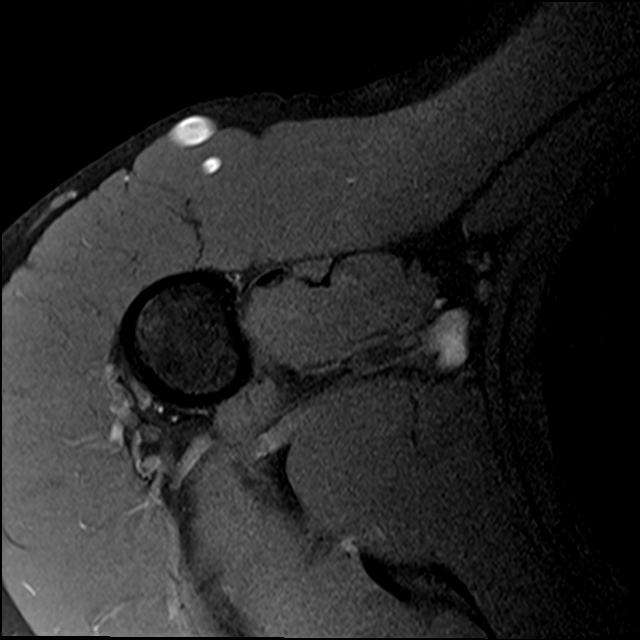
[im 3/22]
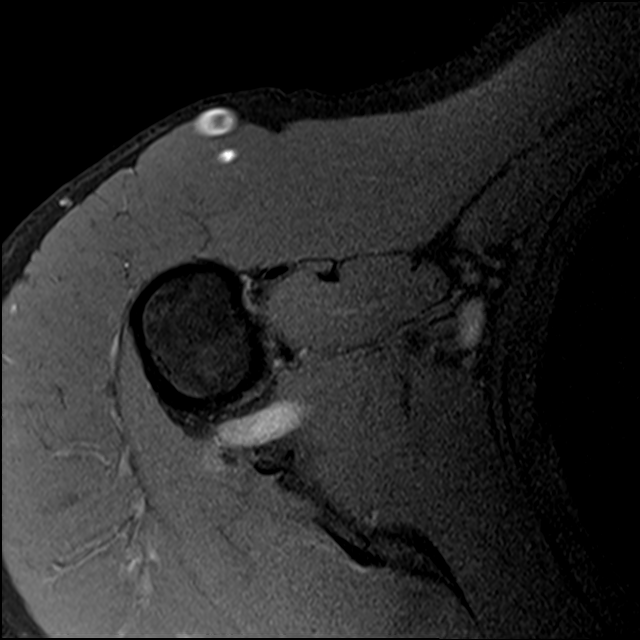
[im 6/22]
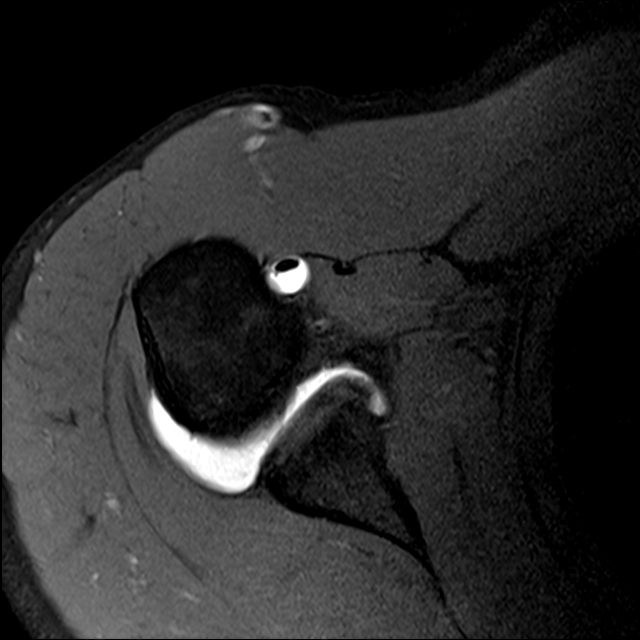
[im 11/22]
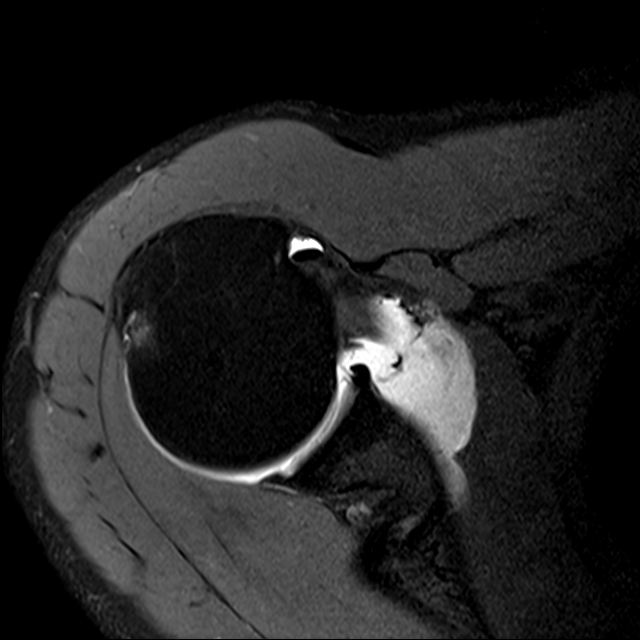
[im 19/22]
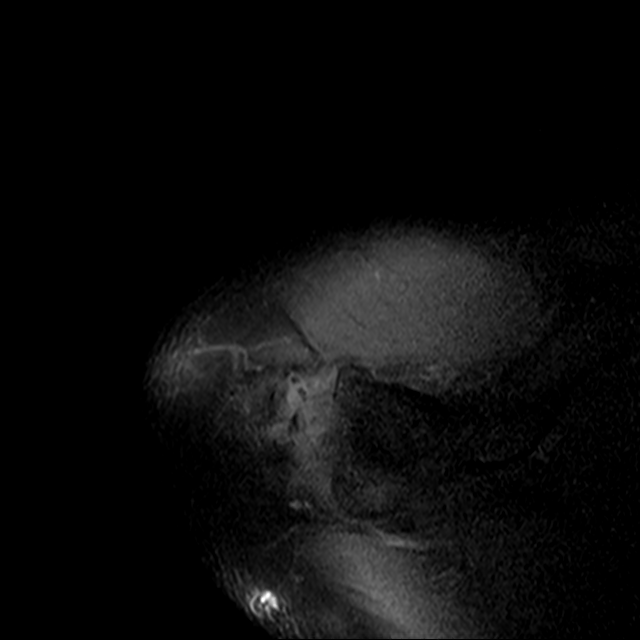

[Series 3: T1 fat-sat · oblique · 4.0mm · 0.22mm/px · 3 of 18 slices shown (2 of 2)]
[im 4/18]
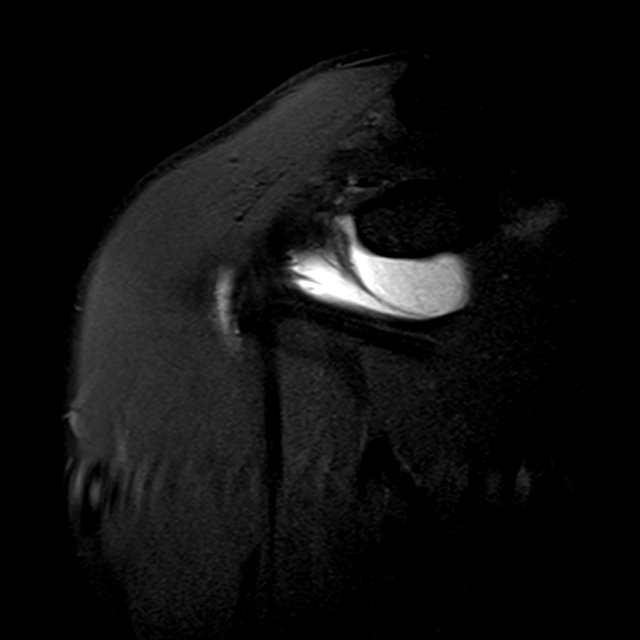
[im 11/18]
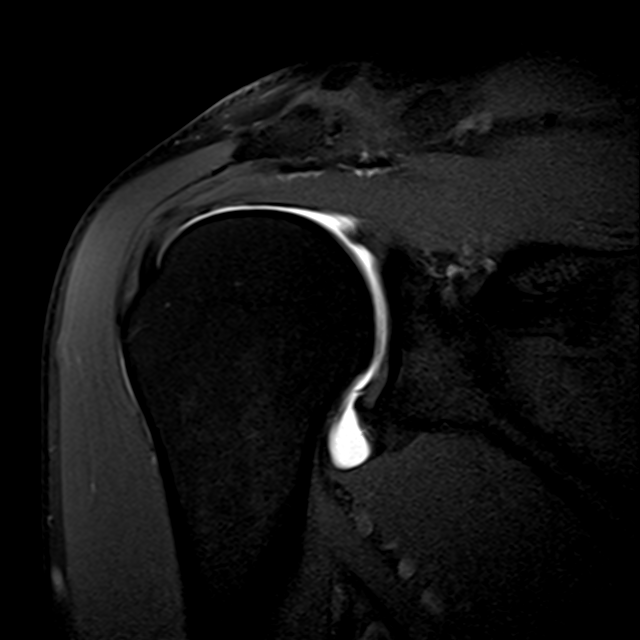
[im 18/18]
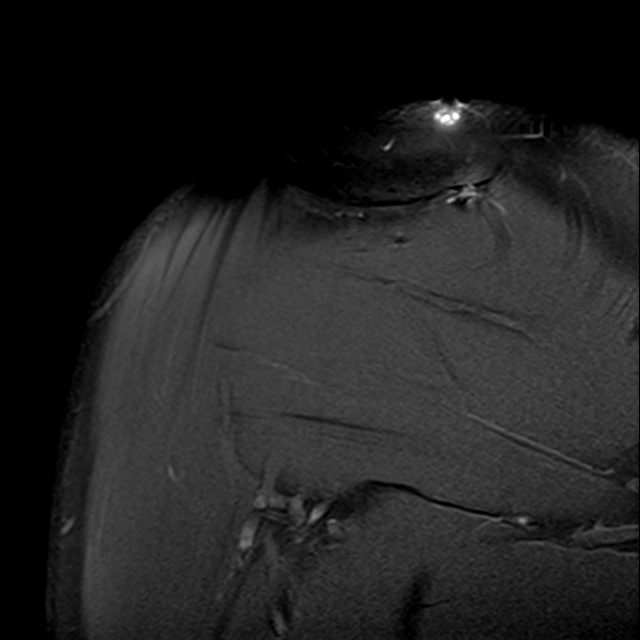

[Series 4: T1 · oblique · 4.0mm · 0.22mm/px · 3 of 18 slices shown]
[im 4/18]
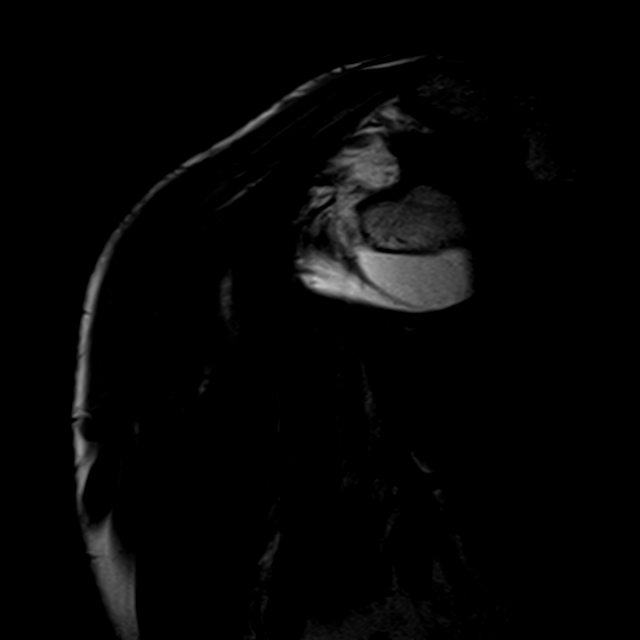
[im 11/18]
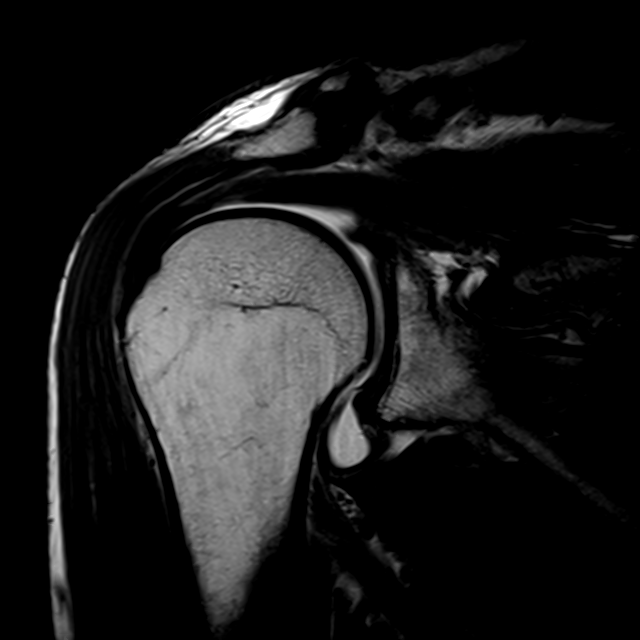
[im 18/18]
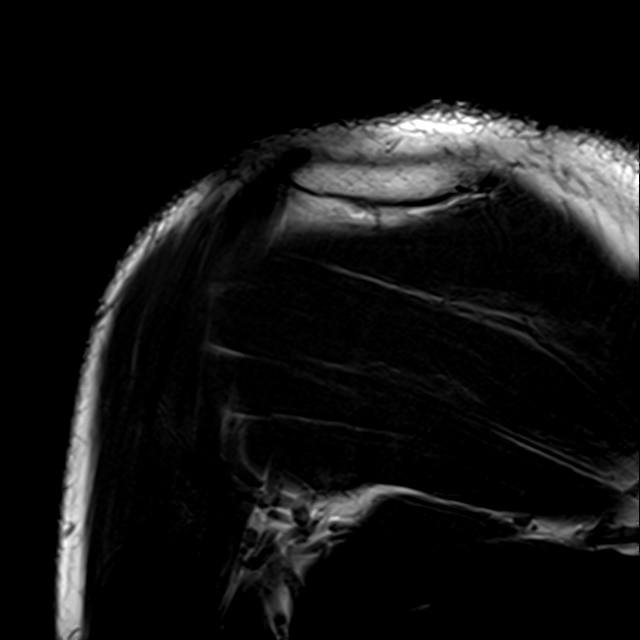

[Series 5: T2 fat-sat · oblique · 4.0mm · 0.44mm/px · 7 of 20 slices shown]
[im 1/20]
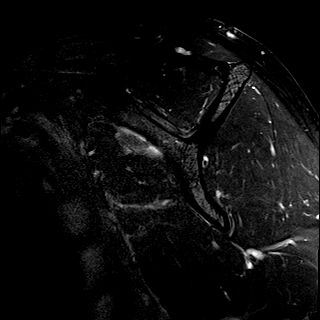
[im 4/20]
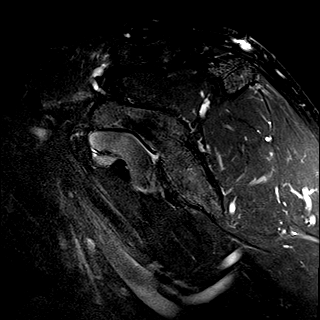
[im 7/20]
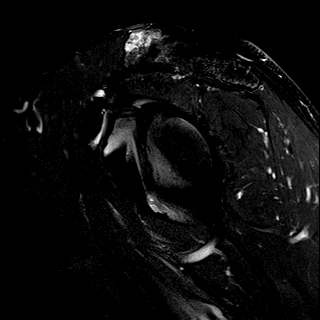
[im 10/20]
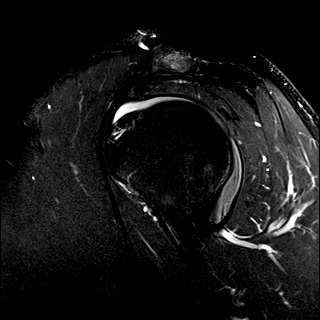
[im 13/20]
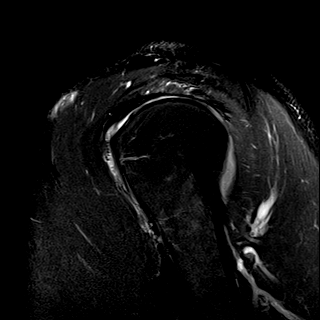
[im 16/20]
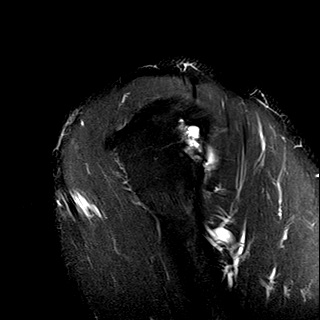
[im 20/20]
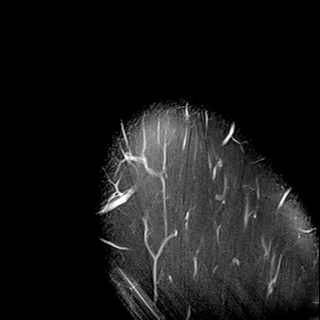

[18 of 40 positions shown; findings below may reference images not displayed]

FINDINGS: Rotator cuff: Supraspinatus tendon is intact. Infraspinatus tendon
is intact. Teres minor tendon is intact. Subscapularis tendon is
intact.

Muscles: No atrophy or fatty replacement of nor abnormal signal
within, the muscles of the rotator cuff.

Biceps long head: Intact.

Acromioclavicular Joint: Moderate degenerative changes of the
acromioclavicular joint. Type I acromion. No significant
subacromial/subdeltoid bursal fluid.

Glenohumeral Joint: Intraarticular contrast distending the joint
capsule. No chondral defect.

Labrum: Intact.

Bones: Reactive marrow changes in the superolateral humeral head at
the infraspinatus insertion. No other marrow signal abnormality. No
fracture or dislocation.
IMPRESSION: 1. No internal derangement of the right shoulder.
2. Moderate degenerative changes of the acromioclavicular joint with
adjacent reactive marrow edema.

## 2021-09-25 ENCOUNTER — Other Ambulatory Visit: Payer: Self-pay | Admitting: Urology

## 2022-10-23 ENCOUNTER — Other Ambulatory Visit: Payer: Self-pay | Admitting: Urology

## 2023-11-01 ENCOUNTER — Other Ambulatory Visit: Payer: Self-pay | Admitting: Urology
